# Patient Record
Sex: Male | Born: 1985 | Race: White | Hispanic: No | Marital: Single | Smoking: Current every day smoker
Health system: Southern US, Community
[De-identification: ages and names within clinical notes are randomized; demographics above are authoritative.]

## PROBLEM LIST (undated history)

## (undated) DIAGNOSIS — I639 Cerebral infarction, unspecified: Secondary | ICD-10-CM

## (undated) HISTORY — PX: WISDOM TOOTH EXTRACTION: SHX21

---
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
## 2003-06-26 ENCOUNTER — Emergency Department (HOSPITAL_COMMUNITY): Admission: EM | Admit: 2003-06-26 | Discharge: 2003-06-27 | Payer: Self-pay | Admitting: Emergency Medicine

## 2005-05-04 ENCOUNTER — Emergency Department (HOSPITAL_COMMUNITY): Admission: EM | Admit: 2005-05-04 | Discharge: 2005-05-04 | Payer: Self-pay | Admitting: *Deleted

## 2005-06-07 ENCOUNTER — Emergency Department (HOSPITAL_COMMUNITY): Admission: EM | Admit: 2005-06-07 | Discharge: 2005-06-07 | Payer: Self-pay | Admitting: Family Medicine

## 2005-09-21 ENCOUNTER — Emergency Department (HOSPITAL_COMMUNITY): Admission: AC | Admit: 2005-09-21 | Discharge: 2005-09-21 | Payer: Self-pay

## 2006-08-31 ENCOUNTER — Emergency Department: Payer: Self-pay | Admitting: Emergency Medicine

## 2007-07-18 ENCOUNTER — Inpatient Hospital Stay (HOSPITAL_COMMUNITY): Admission: EM | Admit: 2007-07-18 | Discharge: 2007-07-20 | Payer: Self-pay | Admitting: Emergency Medicine

## 2007-09-17 ENCOUNTER — Emergency Department (HOSPITAL_COMMUNITY): Admission: EM | Admit: 2007-09-17 | Discharge: 2007-09-17 | Payer: Self-pay | Admitting: Emergency Medicine

## 2010-05-18 ENCOUNTER — Emergency Department (HOSPITAL_COMMUNITY): Admission: EM | Admit: 2010-05-18 | Discharge: 2010-05-18 | Payer: Self-pay | Admitting: Emergency Medicine

## 2010-07-04 ENCOUNTER — Emergency Department (HOSPITAL_COMMUNITY): Admission: EM | Admit: 2010-07-04 | Discharge: 2010-07-04 | Payer: Self-pay | Admitting: Emergency Medicine

## 2010-11-13 ENCOUNTER — Emergency Department (HOSPITAL_BASED_OUTPATIENT_CLINIC_OR_DEPARTMENT_OTHER)
Admission: EM | Admit: 2010-11-13 | Discharge: 2010-11-13 | Disposition: A | Discharge status: Home / Self Care | Payer: Self-pay | Attending: Emergency Medicine | Admitting: Emergency Medicine

## 2010-11-13 ENCOUNTER — Emergency Department (INDEPENDENT_AMBULATORY_CARE_PROVIDER_SITE_OTHER): Payer: Self-pay

## 2010-11-13 DIAGNOSIS — I10 Essential (primary) hypertension: Secondary | ICD-10-CM | POA: Insufficient documentation

## 2010-11-13 DIAGNOSIS — IMO0002 Reserved for concepts with insufficient information to code with codable children: Secondary | ICD-10-CM | POA: Insufficient documentation

## 2010-11-13 DIAGNOSIS — M25519 Pain in unspecified shoulder: Secondary | ICD-10-CM

## 2010-11-13 DIAGNOSIS — Y93B3 Activity, free weights: Secondary | ICD-10-CM | POA: Insufficient documentation

## 2010-11-13 DIAGNOSIS — Y92009 Unspecified place in unspecified non-institutional (private) residence as the place of occurrence of the external cause: Secondary | ICD-10-CM | POA: Insufficient documentation

## 2010-11-13 DIAGNOSIS — X503XXA Overexertion from repetitive movements, initial encounter: Secondary | ICD-10-CM | POA: Insufficient documentation

## 2010-12-18 ENCOUNTER — Emergency Department (INDEPENDENT_AMBULATORY_CARE_PROVIDER_SITE_OTHER): Payer: Self-pay

## 2010-12-18 ENCOUNTER — Emergency Department (HOSPITAL_BASED_OUTPATIENT_CLINIC_OR_DEPARTMENT_OTHER)
Admission: EM | Admit: 2010-12-18 | Discharge: 2010-12-18 | Disposition: A | Discharge status: Home / Self Care | Payer: Self-pay | Attending: Emergency Medicine | Admitting: Emergency Medicine

## 2010-12-18 DIAGNOSIS — W208XXA Other cause of strike by thrown, projected or falling object, initial encounter: Secondary | ICD-10-CM | POA: Insufficient documentation

## 2010-12-18 DIAGNOSIS — I10 Essential (primary) hypertension: Secondary | ICD-10-CM | POA: Insufficient documentation

## 2010-12-18 DIAGNOSIS — S92919A Unspecified fracture of unspecified toe(s), initial encounter for closed fracture: Secondary | ICD-10-CM

## 2010-12-18 DIAGNOSIS — S91109A Unspecified open wound of unspecified toe(s) without damage to nail, initial encounter: Secondary | ICD-10-CM | POA: Insufficient documentation

## 2010-12-18 DIAGNOSIS — S97109A Crushing injury of unspecified toe(s), initial encounter: Secondary | ICD-10-CM | POA: Insufficient documentation

## 2010-12-18 DIAGNOSIS — F17200 Nicotine dependence, unspecified, uncomplicated: Secondary | ICD-10-CM | POA: Insufficient documentation

## 2010-12-24 NOTE — Discharge Summary (Signed)
NAMECELESTINO, ACKERMAN             ACCOUNT NO.:  [id number]   MEDICAL RECORD NO.:  [id number]          PATIENT TYPE:  INP   LOCATION:  3034                         FACILITY:  [facility]   PHYSICIAN:  Deanna Artis. Hickling, M.D.DATE OF BIRTH:  [birth year]   DATE OF ADMISSION:  07/18/2007  DATE OF DISCHARGE:  07/20/2007                               DISCHARGE SUMMARY   FINAL DIAGNOSES:  1. Factitious right hemiparesis, etiology unknown.  2. Polysubstance abuse and dependence (tobacco, alcohol, recreational      drugs and prescription drugs).  3. Diffuse nonspecific pain syndrome.   PROCEDURES:  1. An MRI scan of the brain without contrast.  2. A CT scan of the brain.   EVALUATIONS:  By physical therapy and psychiatry (Dr. Antonietta Breach).   SUMMARY OF THE HOSPITALIZATION:  The patient was admitted with altered  mental status related to severe inebriation (alcohol level 271) and with  an apparent right hemiparesis.  The patient is left-hand dominant.  His  speech was slurred.  He was able to speak in sentences but he could not  tell me his age, he was not cooperative, he had apparent photophobia.  He complained of severe headache just prior to this event.  He moved his  left side well.  He moved his right arm very little and his leg to a  slight degree.  He did not withdraw to noxious stimuli on the right  side.   I was concerned about the possibility of stroke, particularly given that  there was a history of stroke evaluated at [facility]  earlier in the year.   The MRI scan under sedation (Geodon plus Ativan) was normal.  There were  no prior lesions, no acute lesions.  The patient had a right sinusitis.  I could not rule out a seizure with a Todd's paresis because the  patient's weakness occurred when he was not being witnessed by anyone.   I was concerned about ongoing belligerent behavior and alcohol  withdrawal, but that never came to pass.  The patient had a  psychiatric  consult with Dr. Antonietta Breach who said that he had polysubstance  dependence but he did not appear to have a somatoform disorder.   The physical therapist during her evaluation initially observed a  strength of 3/5 in the right lower extremity, 4/5 to 5/5 in the left  lower extremity, normal in the upper extremities.  She noted that the  patient initially was dragging his right foot and his stride returned to  normal with very fast gait and no loss of balance.   In speaking with the patient's roommate and his parents, it is very  clear that he has refused to fully embrace any attempts to help him deal  with the substance abuse or any underlying psychiatric disorders that he  has.   On examination today the patient was awake, alert, with no complaints.  Temperature 97.6, blood pressure 136/94.  All other blood pressures show  normal diastolic blood pressures and I do not believe he has  hypertension.  Resting pulse 71, respirations 20, oxygen  saturation  100%.  The patient was asleep when I arrived.  His lungs were clear.   Neurologic examination:  Visual fields are full.  Symmetric facial  strength, midline tongue, air conduction greater than bone conduction.  Motor examination:  Normal strength in all four extremities.  Sensation  intact.  Gait had the appearance of right hemiparetic gait, sliding his  foot along the floor, a little less arm swing on the right.  I believe  that this is a factitious right hemiparesis and that he has substance  abuse.   PLAN:  Outpatient therapy as per Dr. Jeanie Sewer.  I have given the  patient my card but I have asked him to consider that there are many  people who care about him and that he needs to care about himself as  much as they do.  I told him that the behaviors that are his daily life  are leading nowhere but to permanent illness and disease that we cannot  fix.  Will not provide for narcotics or prescription drugs.  He is   advised to use naproxen over-the-counter and to engage in outpatient  substance abuse program.  Followup with me as needed.   His laboratory studies were as follows:  White blood cell count 9200;  hemoglobin 16.1; hematocrit 46.9; MCV 88.6; platelet count 425,000; 58  neutrophils, 32 lymphs, 7 monocytes, 3 eosinophils, 1 basophil.  Prothrombin time 12.7, INR 0.9, PT 31.  Sodium 136, potassium 3.8,  chloride 101, CO2 24, glucose 98, BUN 7, creatinine 1.29, GFR greater  than 60, total bilirubin 0.7, alkaline phosphatase 81, AST 37, ALT 30,  total protein 7.5, albumin 4.3, calcium 8.6.  Creatinine kinase 462 (the  patient had fallen), CK-MB slightly elevated at 5.2, troponin 0.03  - no  indication of myocardial injury.  Urine drug screen positive for  cocaine.  Serum alcohol 271.   The patient is discharged in improved condition.      Deanna Artis. Sharene Skeans, M.D.  Electronically Signed     [facility]/MEDQ  D:  07/20/2007  T:  07/20/2007  Job:  [id number]
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
## 2010-12-27 ENCOUNTER — Emergency Department (HOSPITAL_BASED_OUTPATIENT_CLINIC_OR_DEPARTMENT_OTHER)
Admission: EM | Admit: 2010-12-27 | Discharge: 2010-12-27 | Disposition: A | Discharge status: Home / Self Care | Payer: Self-pay | Attending: Emergency Medicine | Admitting: Emergency Medicine

## 2010-12-27 DIAGNOSIS — I10 Essential (primary) hypertension: Secondary | ICD-10-CM | POA: Insufficient documentation

## 2010-12-27 DIAGNOSIS — Z4802 Encounter for removal of sutures: Secondary | ICD-10-CM | POA: Insufficient documentation

## 2010-12-27 NOTE — Consult Note (Signed)
NAMEJAZION, Jacob [name]             ACCOUNT NO.:  [id number]   MEDICAL RECORD NO.:  [id number]          PATIENT TYPE:  INP   LOCATION:  3034                         FACILITY:  [facility]   PHYSICIAN:  Antonietta Breach, M.D.  DATE OF BIRTH:  [birth year]   DATE OF CONSULTATION:  DATE OF DISCHARGE:  07/20/2007                                 CONSULTATION   REQUESTING PHYSICIAN:  Deanna Artis. Sharene Skeans, M.D.   REASON FOR CONSULTATION:  Rule out somatoform disorder.   HISTORY:  Jacob [name] is a 25 year old male admitted to the [facility] on July 18, 2007, with a complaint of headache along with  right-sided weakness and slurred speech.   The patient notably has been abusing a number of substances including  alcohol.  He does have a history of using cocaine as well as marijuana.   The patient's difficulties with strength have resolved.  He is moving  all his extremities and walking.  He has intact interests and  constructive future goals.  He is not having any thoughts of harming  himself or others.   PAST PSYCHIATRIC HISTORY:  The patient had a period of severe agitation  in February 2007, when intoxicated on alcohol.  He was treated in the  emergency room with Geodon.  At that time also his urine tested positive  for marijuana and cocaine.   The patient has been taking Lexapro and Seroquel.   Please see the medical record.   MENTAL STATUS EXAM:  Jacob [name] is alert, oriented to all spheres.  Memory within normal limits.  Thought process logical, coherent, goal-  directed, thought content.  No thoughts of harming himself.  No thoughts  of harming others.  No delusions.  No hallucinations.  Affect is broad  and appropriate.  Judgment is within normal limits.  Insight is partial.  Affect broad and appropriate.  Mood within normal limits.   ASSESSMENT:  Polysubstance dependence.  No evidence of somatoform  disorder.   Jacob [name] is not at risk to harm himself or others.  He  does agree to  use emergency services immediately for any psychiatric emergency  symptoms.   RECOMMENDATIONS:  The patient could benefit from outpatient  psychotherapy.  He could also benefit from 12-step groups and chemical  dependency program.      Antonietta Breach, M.D.  Electronically Signed     JW/MEDQ  D:  12/22/2007  T:  12/23/2007  Job:  [id number]

## 2011-05-19 LAB — PROTIME-INR: Prothrombin Time: 12.7

## 2011-05-19 LAB — CK TOTAL AND CKMB (NOT AT ARMC)
CK, MB: 5.2 — ABNORMAL HIGH
Relative Index: 1.1
Total CK: 462 — ABNORMAL HIGH

## 2011-05-19 LAB — COMPREHENSIVE METABOLIC PANEL
ALT: 30
Alkaline Phosphatase: 81
BUN: 7
Chloride: 101
GFR calc Af Amer: >60
Sodium: 136
Total Bilirubin: 0.7

## 2011-05-19 LAB — DIFFERENTIAL
Basophils Relative: 1
Eosinophils Absolute: 0.2
Lymphocytes Relative: 32
Neutrophils Relative %: 58

## 2011-05-19 LAB — RAPID URINE DRUG SCREEN, HOSP PERFORMED
Barbiturates: NOT DETECTED
Benzodiazepines: NOT DETECTED
Cocaine: POSITIVE — AB
Opiates: NOT DETECTED

## 2011-05-19 LAB — APTT: aPTT: 31

## 2011-05-19 LAB — TROPONIN I: Troponin I: 0.03

## 2011-05-19 LAB — CBC
HCT: 46.9
MCHC: 34.4
MCV: 88.6
RBC: 5.29

## 2011-05-19 LAB — ETHANOL: Alcohol, Ethyl (B): 271 — ABNORMAL HIGH

## 2012-12-03 ENCOUNTER — Encounter (HOSPITAL_COMMUNITY): Payer: Self-pay | Admitting: Emergency Medicine

## 2012-12-03 ENCOUNTER — Emergency Department (HOSPITAL_COMMUNITY)
Admission: EM | Admit: 2012-12-03 | Discharge: 2012-12-03 | Disposition: A | Discharge status: Home / Self Care | Payer: Self-pay | Attending: Emergency Medicine | Admitting: Emergency Medicine

## 2012-12-03 DIAGNOSIS — Y9389 Activity, other specified: Secondary | ICD-10-CM | POA: Insufficient documentation

## 2012-12-03 DIAGNOSIS — R404 Transient alteration of awareness: Secondary | ICD-10-CM | POA: Insufficient documentation

## 2012-12-03 DIAGNOSIS — F17200 Nicotine dependence, unspecified, uncomplicated: Secondary | ICD-10-CM | POA: Insufficient documentation

## 2012-12-03 DIAGNOSIS — T401X1A Poisoning by heroin, accidental (unintentional), initial encounter: Secondary | ICD-10-CM | POA: Insufficient documentation

## 2012-12-03 DIAGNOSIS — T401X4A Poisoning by heroin, undetermined, initial encounter: Secondary | ICD-10-CM | POA: Insufficient documentation

## 2012-12-03 DIAGNOSIS — Y92009 Unspecified place in unspecified non-institutional (private) residence as the place of occurrence of the external cause: Secondary | ICD-10-CM | POA: Insufficient documentation

## 2012-12-03 DIAGNOSIS — F1010 Alcohol abuse, uncomplicated: Secondary | ICD-10-CM | POA: Insufficient documentation

## 2012-12-03 DIAGNOSIS — Z8673 Personal history of transient ischemic attack (TIA), and cerebral infarction without residual deficits: Secondary | ICD-10-CM | POA: Insufficient documentation

## 2012-12-03 HISTORY — DX: Cerebral infarction, unspecified: I63.9

## 2012-12-03 MED ORDER — ONDANSETRON HCL 4 MG/2ML IJ SOLN
4.0000 mg | Freq: Once | INTRAMUSCULAR | Status: AC
  Administered 2012-12-03: 4 mg via INTRAVENOUS
  Filled 2012-12-03: qty 2

## 2012-12-03 NOTE — ED Notes (Signed)
Bed:RESB<BR> Expected date:<BR> Expected time:<BR> Means of arrival:<BR> Comments:<BR>
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
## 2012-12-03 NOTE — ED Provider Notes (Signed)
History     CSN: [id number]  Arrival date & time 12/03/12  2136   First MD Initiated Contact with Patient 12/03/12 2146      Chief Complaint  Patient presents with  . Drug Overdose    (Consider location/radiation/quality/duration/timing/severity/associated sxs/prior treatment) Patient is a 27 y.o. male presenting with Overdose. The history is provided by the patient.  Drug Overdose   patient here after starting heroin and becoming unresponsive after that. EMS was called and patient agonal breathing. He was given Narcan and completely returned to his baseline. He did have pinpoint pupils prior to this. He denies that this was a suicide attempt. Admits to using alcohol tonight. States he was attempting to become intoxicated and in that is why he used heroine. No other complaints of at this time  Past Medical History  Diagnosis Date  . Stroke     Past Surgical History  Procedure Laterality Date  . Wisdom tooth extraction      No family history on file.  History  Substance Use Topics  . Smoking status: Current Every Day Smoker -- 0.50 packs/day for 10 years    Types: Cigarettes  . Smokeless tobacco: Never Used  . Alcohol Use: 7.2 oz/week    12 Cans of beer per week      Review of Systems  All other systems reviewed and are negative.    Allergies  Review of patient's allergies indicates no known allergies.  Home Medications  No current outpatient prescriptions on file.  BP 163/106  Pulse 98  Resp 16  SpO2 100%  Physical Exam  Nursing note and vitals reviewed. Constitutional: He is oriented to person, place, and time. He appears well-developed and well-nourished.  Non-toxic appearance. No distress.  HENT:  Head: Normocephalic and atraumatic.  Eyes: Conjunctivae, EOM and lids are normal. Pupils are equal, round, and reactive to light.  Neck: Normal range of motion. Neck supple. No tracheal deviation present. No mass present.  Cardiovascular: Normal rate,  regular rhythm and normal heart sounds.  Exam reveals no gallop.   No murmur heard. Pulmonary/Chest: Effort normal and breath sounds normal. No stridor. No respiratory distress. He has no decreased breath sounds. He has no wheezes. He has no rhonchi. He has no rales.  Abdominal: Soft. Normal appearance and bowel sounds are normal. He exhibits no distension. There is no tenderness. There is no rebound and no CVA tenderness.  Musculoskeletal: Normal range of motion. He exhibits no edema and no tenderness.  Neurological: He is alert and oriented to person, place, and time. He has normal strength. No cranial nerve deficit or sensory deficit. GCS eye subscore is 4. GCS verbal subscore is 5. GCS motor subscore is 6.  Skin: Skin is warm and dry. No abrasion and no rash noted.  Psychiatric: He has a normal mood and affect. His speech is normal and behavior is normal.    ED Course  Procedures (including critical care time)  Labs Reviewed - No data to display No results found.   No diagnosis found.    MDM  Pt monitored here and has remained at stable--ok for d/c        Toy Baker, MD 12/03/12 2318

## 2013-02-11 ENCOUNTER — Emergency Department (HOSPITAL_BASED_OUTPATIENT_CLINIC_OR_DEPARTMENT_OTHER): Payer: No Typology Code available for payment source

## 2013-02-11 ENCOUNTER — Emergency Department (HOSPITAL_COMMUNITY): Payer: Self-pay

## 2013-02-11 ENCOUNTER — Emergency Department (HOSPITAL_BASED_OUTPATIENT_CLINIC_OR_DEPARTMENT_OTHER)
Admission: EM | Admit: 2013-02-11 | Discharge: 2013-02-11 | Disposition: A | Discharge status: Home / Self Care | Payer: No Typology Code available for payment source | Attending: Emergency Medicine | Admitting: Emergency Medicine

## 2013-02-11 ENCOUNTER — Encounter (HOSPITAL_BASED_OUTPATIENT_CLINIC_OR_DEPARTMENT_OTHER): Payer: Self-pay | Admitting: *Deleted

## 2013-02-11 DIAGNOSIS — S40019A Contusion of unspecified shoulder, initial encounter: Secondary | ICD-10-CM | POA: Insufficient documentation

## 2013-02-11 DIAGNOSIS — S335XXA Sprain of ligaments of lumbar spine, initial encounter: Secondary | ICD-10-CM | POA: Insufficient documentation

## 2013-02-11 DIAGNOSIS — Z8673 Personal history of transient ischemic attack (TIA), and cerebral infarction without residual deficits: Secondary | ICD-10-CM | POA: Insufficient documentation

## 2013-02-11 DIAGNOSIS — S0993XA Unspecified injury of face, initial encounter: Secondary | ICD-10-CM | POA: Insufficient documentation

## 2013-02-11 DIAGNOSIS — S39012A Strain of muscle, fascia and tendon of lower back, initial encounter: Secondary | ICD-10-CM

## 2013-02-11 DIAGNOSIS — S161XXA Strain of muscle, fascia and tendon at neck level, initial encounter: Secondary | ICD-10-CM

## 2013-02-11 DIAGNOSIS — S0990XA Unspecified injury of head, initial encounter: Secondary | ICD-10-CM | POA: Insufficient documentation

## 2013-02-11 DIAGNOSIS — F17200 Nicotine dependence, unspecified, uncomplicated: Secondary | ICD-10-CM | POA: Insufficient documentation

## 2013-02-11 DIAGNOSIS — S0280XA Fracture of other specified skull and facial bones, unspecified side, initial encounter for closed fracture: Secondary | ICD-10-CM | POA: Insufficient documentation

## 2013-02-11 DIAGNOSIS — S40011A Contusion of right shoulder, initial encounter: Secondary | ICD-10-CM

## 2013-02-11 DIAGNOSIS — IMO0002 Reserved for concepts with insufficient information to code with codable children: Secondary | ICD-10-CM | POA: Insufficient documentation

## 2013-02-11 DIAGNOSIS — S139XXA Sprain of joints and ligaments of unspecified parts of neck, initial encounter: Secondary | ICD-10-CM | POA: Insufficient documentation

## 2013-02-11 DIAGNOSIS — Y92410 Unspecified street and highway as the place of occurrence of the external cause: Secondary | ICD-10-CM | POA: Insufficient documentation

## 2013-02-11 DIAGNOSIS — S46909A Unspecified injury of unspecified muscle, fascia and tendon at shoulder and upper arm level, unspecified arm, initial encounter: Secondary | ICD-10-CM | POA: Insufficient documentation

## 2013-02-11 DIAGNOSIS — S4980XA Other specified injuries of shoulder and upper arm, unspecified arm, initial encounter: Secondary | ICD-10-CM | POA: Insufficient documentation

## 2013-02-11 DIAGNOSIS — Y939 Activity, unspecified: Secondary | ICD-10-CM | POA: Insufficient documentation

## 2013-02-11 MED ORDER — HYDROCODONE-ACETAMINOPHEN 5-325 MG PO TABS: 2.0000 | ORAL_TABLET | ORAL | Status: DC | PRN

## 2013-02-11 MED ORDER — KETOROLAC TROMETHAMINE 60 MG/2ML IM SOLN
60.0000 mg | Freq: Once | INTRAMUSCULAR | Status: AC
  Administered 2013-02-11: 60 mg via INTRAMUSCULAR
  Filled 2013-02-11: qty 2
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
## 2013-06-07 ENCOUNTER — Encounter (HOSPITAL_BASED_OUTPATIENT_CLINIC_OR_DEPARTMENT_OTHER): Payer: Self-pay | Admitting: Emergency Medicine

## 2013-06-07 ENCOUNTER — Emergency Department (HOSPITAL_BASED_OUTPATIENT_CLINIC_OR_DEPARTMENT_OTHER)
Admission: EM | Admit: 2013-06-07 | Discharge: 2013-06-07 | Disposition: A | Discharge status: Home / Self Care | Attending: Emergency Medicine | Admitting: Emergency Medicine

## 2013-06-07 DIAGNOSIS — T7840XA Allergy, unspecified, initial encounter: Secondary | ICD-10-CM

## 2013-06-07 DIAGNOSIS — Z8673 Personal history of transient ischemic attack (TIA), and cerebral infarction without residual deficits: Secondary | ICD-10-CM | POA: Insufficient documentation

## 2013-06-07 DIAGNOSIS — K089 Disorder of teeth and supporting structures, unspecified: Secondary | ICD-10-CM | POA: Insufficient documentation

## 2013-06-07 DIAGNOSIS — F17200 Nicotine dependence, unspecified, uncomplicated: Secondary | ICD-10-CM | POA: Insufficient documentation

## 2013-06-07 MED ORDER — PREDNISONE 10 MG PO TABS: 20.0000 mg | ORAL_TABLET | Freq: Every day | ORAL | Status: DC

## 2013-06-07 MED ORDER — IBUPROFEN 800 MG PO TABS
800.0000 mg | ORAL_TABLET | Freq: Once | ORAL | Status: AC
  Administered 2013-06-07: 800 mg via ORAL
  Filled 2013-06-07: qty 1

## 2013-06-07 MED ORDER — EPINEPHRINE 0.3 MG/0.3ML IJ SOAJ: 0.3000 mg | INTRAMUSCULAR | Status: AC | PRN

## 2013-06-07 MED ORDER — PREDNISONE 50 MG PO TABS
60.0000 mg | ORAL_TABLET | Freq: Once | ORAL | Status: AC
  Administered 2013-06-07: 60 mg via ORAL
  Filled 2013-06-07 (×2): qty 1

## 2013-06-07 MED ORDER — DIPHENHYDRAMINE HCL 25 MG PO TABS: 25.0000 mg | ORAL_TABLET | Freq: Four times a day (QID) | ORAL | Status: DC
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
## 2013-06-07 NOTE — ED Provider Notes (Signed)
CSN: [id number]     Arrival date & time 06/07/13  1745 History  This chart was scribed for No att. providers found by Ronal Fear, ED Scribe. This patient was seen in room MH09/MH09 and the patient's care was started at 7:01 PM.   Chief Complaint  Patient presents with  . Allergic Reaction    Patient is a 27 y.o. male presenting with allergic reaction. The history is provided by the patient. No language interpreter was used.  Allergic Reaction Presenting symptoms: swelling   Presenting symptoms: no difficulty breathing and no wheezing   Severity:  Mild Context: chemicals   Relieved by:  Epinephrine and antihistamines Worsened by:  Nothing tried  HPI Comments: Jacob [name] is a 27 y.o. male with a history of strokes who presents to the Emergency Department complaining of gradual onset tongue swelling after drinking water at a jail around 4:00pm today. Pt was given benadryl one shot of epinephrine at the jail with some relief around 4:30 pm. Upon arrival to the ED h he no longer has tongue swelling. Currently c/o dental pain from a broken tooth..  Pt denies sore throat prior to occurrence. Pt is also complaining of dental pain. Pt does not appear to be in any acute distress with no other complaints. No rash or itching.  Denies new exposures or foods today except feels the symptoms began after drinking from a water fountain.  No hx of prior allergic reactions.    Past Medical History  Diagnosis Date  . Stroke    Past Surgical History  Procedure Laterality Date  . Wisdom tooth extraction     No family history on file. History  Substance Use Topics  . Smoking status: Current Every Day Smoker -- 0.50 packs/day for 10 years    Types: Cigarettes  . Smokeless tobacco: Never Used  . Alcohol Use: 7.2 oz/week    12 Cans of beer per week    Review of Systems  HENT: Positive for facial swelling. Negative for sore throat.   Respiratory: Negative for wheezing.   All other systems  reviewed and are negative.    Allergies  Review of patient's allergies indicates no known allergies.  Home Medications   Current Outpatient Rx  Name  Route  Sig  Dispense  Refill  . diphenhydrAMINE (BENADRYL) 25 MG tablet   Oral   Take 1 tablet (25 mg total) by mouth every 6 (six) hours. For the next 2-3 days, then space out to an as needed basis   20 tablet   0   . EPINEPHrine (EPIPEN) 0.3 mg/0.3 mL SOAJ injection   Intramuscular   Inject 0.3 mLs (0.3 mg total) into the muscle as needed.   2 Device   0   . HYDROcodone-acetaminophen (NORCO) 5-325 MG per tablet   Oral   Take 2 tablets by mouth every 4 (four) hours as needed for pain.   15 tablet   0   . predniSONE (DELTASONE) 10 MG tablet   Oral   Take 2 tablets (20 mg total) by mouth daily. 6, 5, 4, 3, 2, 1 taper- take for 3 days each po qD   63 tablet   0    BP 121/70  Pulse 72  Temp(Src) 99.2 F (37.3 C) (Oral)  Resp 16  Ht 5\' 11"  (1.803 m)  Wt 196 lb (88.905 kg)  BMI 27.35 kg/m2  SpO2 100% Physical Exam  Nursing note and vitals reviewed. Constitutional: He is oriented to person, place,  and time. He appears well-developed and well-nourished. No distress.  HENT:  Head: Normocephalic and atraumatic.  Mouth/Throat: Oropharynx is clear and moist.  No hives, no swelling of the tongue  Eyes: EOM are normal. Pupils are equal, round, and reactive to light.  Neck: Normal range of motion. Neck supple. No tracheal deviation present.  Cardiovascular: Normal rate.   Pulmonary/Chest: Effort normal and breath sounds normal. No respiratory distress.  Abdominal: Soft. Bowel sounds are normal. There is no tenderness.  Musculoskeletal: Normal range of motion. He exhibits no edema.  Neurological: He is alert and oriented to person, place, and time.  Skin: Skin is warm and dry.  Psychiatric: He has a normal mood and affect. His behavior is normal.    ED Course  Procedures (including critical care time) DIAGNOSTIC  STUDIES: Oxygen Saturation is 99% on RA, normal by my interpretation.    COORDINATION OF CARE:    7:08 PM- Pt advised of plan for treatment observation and steroids for swelling and pt agrees.  8:59 PM pt has been observed x 4 hours since he received epi pen.  He has received steroids here in the ED.  No recurrence of tongue swelling.  Will discharge on steroids and to received scheduled benadryl.  Will also give epi pen rx.  Pt will need allergy testing.     Labs Review Labs Reviewed - No data to display Imaging Review No results found.  EKG Interpretation   None       MDM   1. Allergic reaction, initial encounter    Pt presenting with c/o tongue swelling and report of [facility].  He was treated there with benadryl and epipen.  Symptoms upon arrival have resolved.  No airway compromise or significant swelling or lips or tongue on my exam.  Pt speaking normally.  No wheezing.  Pt given steroids and observed until 4 hours after epipen.  No increase in symptoms.  Discharged to take benadryl every 6 hours for the next 2-3 days, in addition to steroids.  Given rx for epipen and follow up information for allergy testing.  Discharged with strict return precautions.  Pt agreeable with plan.  I personally performed the services described in this documentation, which was scribed in my presence. The recorded information has been reviewed and is accurate.    Ethelda Chick, MD 06/07/13 2134

## 2013-06-26 ENCOUNTER — Emergency Department (HOSPITAL_BASED_OUTPATIENT_CLINIC_OR_DEPARTMENT_OTHER)
Admission: EM | Admit: 2013-06-26 | Discharge: 2013-06-26 | Disposition: A | Discharge status: Home / Self Care | Payer: No Typology Code available for payment source | Attending: Emergency Medicine | Admitting: Emergency Medicine

## 2013-06-26 ENCOUNTER — Encounter (HOSPITAL_BASED_OUTPATIENT_CLINIC_OR_DEPARTMENT_OTHER): Payer: Self-pay | Admitting: Emergency Medicine

## 2013-06-26 DIAGNOSIS — K089 Disorder of teeth and supporting structures, unspecified: Secondary | ICD-10-CM | POA: Insufficient documentation

## 2013-06-26 DIAGNOSIS — K0889 Other specified disorders of teeth and supporting structures: Secondary | ICD-10-CM

## 2013-06-26 DIAGNOSIS — F17200 Nicotine dependence, unspecified, uncomplicated: Secondary | ICD-10-CM | POA: Insufficient documentation

## 2013-06-26 DIAGNOSIS — Z8673 Personal history of transient ischemic attack (TIA), and cerebral infarction without residual deficits: Secondary | ICD-10-CM | POA: Insufficient documentation

## 2013-06-26 DIAGNOSIS — R599 Enlarged lymph nodes, unspecified: Secondary | ICD-10-CM | POA: Insufficient documentation

## 2013-06-26 DIAGNOSIS — K029 Dental caries, unspecified: Secondary | ICD-10-CM | POA: Insufficient documentation

## 2013-06-26 MED ORDER — TRAMADOL HCL 50 MG PO TABS: 50.0000 mg | ORAL_TABLET | Freq: Four times a day (QID) | ORAL | Status: DC | PRN

## 2013-06-26 MED ORDER — PENICILLIN V POTASSIUM 500 MG PO TABS: 500.0000 mg | ORAL_TABLET | Freq: Three times a day (TID) | ORAL | Status: DC

## 2013-06-26 NOTE — ED Provider Notes (Signed)
CSN: [id number]     Arrival date & time 06/26/13  1609 History   First MD Initiated Contact with Patient 06/26/13 1624     Chief Complaint  Patient presents with  . Dental Pain   (Consider location/radiation/quality/duration/timing/severity/associated sxs/prior Treatment) Patient is a 27 y.o. male presenting with tooth pain. The history is provided by the patient. No language interpreter was used.  Dental Pain Location:  Lower Lower teeth location:  31/RL 2nd molar Associated symptoms: no facial swelling and no fever   Associated symptoms comment:  Dental pain for the past 2 weeks, worsening over time. He has not checked his temperature but reports he feels like he was "warm" with fever, and sweating last night.    Past Medical History  Diagnosis Date  . Stroke    Past Surgical History  Procedure Laterality Date  . Wisdom tooth extraction     No family history on file. History  Substance Use Topics  . Smoking status: Current Every Day Smoker -- 0.50 packs/day for 10 years    Types: Cigarettes  . Smokeless tobacco: Never Used  . Alcohol Use: 7.2 oz/week    12 Cans of beer per week    Review of Systems  Constitutional: Negative for fever.  HENT: Positive for dental problem. Negative for facial swelling.     Allergies  Review of patient's allergies indicates no known allergies.  Home Medications   Current Outpatient Rx  Name  Route  Sig  Dispense  Refill  . EPINEPHrine (EPIPEN) 0.3 mg/0.3 mL SOAJ injection   Intramuscular   Inject 0.3 mLs (0.3 mg total) into the muscle as needed.   2 Device   0    BP 121/76  Pulse 59  Temp(Src) 98.4 F (36.9 C) (Oral)  Resp 16  Wt 200 lb (90.719 kg)  SpO2 100% Physical Exam  Constitutional: He appears well-developed and well-nourished. No distress.  HENT:  Significant decay of #31 without pointing or drainage abscess visualized.   Lymphadenopathy:       Head (right side): Submental adenopathy present.    He has cervical  adenopathy.       Right cervical: Superficial cervical adenopathy present.    ED Course  Procedures (including critical care time) Labs Review Labs Reviewed - No data to display Imaging Review No results found.  EKG Interpretation   None       MDM  No diagnosis found. 1. Dental caries 2. Dental pain  Will conver with abx and give pain control taking patient's drug abuse history (drug overdose/heroin) into consideration.     Arnoldo Hooker, PA-C 06/26/13 1706

## 2013-06-26 NOTE — ED Notes (Signed)
Right sided dental pain x 2 weeks.  No known fever.  No swelling.

## 2013-06-26 NOTE — ED Provider Notes (Signed)
  Medical screening examination/treatment/procedure(s) were performed by non-physician practitioner and as supervising physician I was immediately available for consultation/collaboration.  EKG Interpretation   None          Gerhard Munch, MD 06/26/13 1928

## 2013-08-04 ENCOUNTER — Emergency Department (HOSPITAL_BASED_OUTPATIENT_CLINIC_OR_DEPARTMENT_OTHER)
Admission: EM | Admit: 2013-08-04 | Discharge: 2013-08-04 | Disposition: A | Discharge status: Home / Self Care | Payer: No Typology Code available for payment source | Attending: Emergency Medicine | Admitting: Emergency Medicine

## 2013-08-04 ENCOUNTER — Encounter (HOSPITAL_BASED_OUTPATIENT_CLINIC_OR_DEPARTMENT_OTHER): Payer: Self-pay | Admitting: Emergency Medicine

## 2013-08-04 DIAGNOSIS — Z792 Long term (current) use of antibiotics: Secondary | ICD-10-CM | POA: Insufficient documentation

## 2013-08-04 DIAGNOSIS — Z8673 Personal history of transient ischemic attack (TIA), and cerebral infarction without residual deficits: Secondary | ICD-10-CM | POA: Insufficient documentation

## 2013-08-04 DIAGNOSIS — K029 Dental caries, unspecified: Secondary | ICD-10-CM | POA: Insufficient documentation

## 2013-08-04 DIAGNOSIS — R509 Fever, unspecified: Secondary | ICD-10-CM | POA: Insufficient documentation

## 2013-08-04 DIAGNOSIS — F17200 Nicotine dependence, unspecified, uncomplicated: Secondary | ICD-10-CM | POA: Insufficient documentation

## 2013-08-04 MED ORDER — IBUPROFEN 800 MG PO TABS: 800.0000 mg | ORAL_TABLET | Freq: Three times a day (TID) | ORAL | Status: DC | PRN

## 2013-08-04 MED ORDER — PENICILLIN V POTASSIUM 500 MG PO TABS: 500.0000 mg | ORAL_TABLET | Freq: Four times a day (QID) | ORAL | Status: DC

## 2013-08-04 NOTE — ED Provider Notes (Signed)
TIME SEEN: 10:58 AM  CHIEF COMPLAINT: Dental pain  HPI: Patient is a 27 y.o. male with a history of substance abuse who presents to the emergency department with right lower dental pain for several months. He states that over the past several days he has had swelling of his right face and subjective fevers. No nausea, vomiting or diarrhea. He took 800 mg of ibuprofen at home prior to arrival. Patient was seen in the emergency department approximately one month ago for similar symptoms. 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  ROS: See HPI Constitutional: Subjective fever  Eyes: no drainage  ENT: no runny nose   Cardiovascular:  no chest pain  Resp: no SOB  GI: no vomiting GU: no dysuria Integumentary: no rash  Allergy: no hives  Musculoskeletal: no leg swelling  Neurological: no slurred speech ROS otherwise negative  PAST MEDICAL HISTORY/PAST SURGICAL HISTORY:  Past Medical History  Diagnosis Date  . Stroke     MEDICATIONS:  Prior to Admission medications   Medication Sig Start Date End Date Taking? Authorizing Provider  EPINEPHrine (EPIPEN) 0.3 mg/0.3 mL SOAJ injection Inject 0.3 mLs (0.3 mg total) into the muscle as needed. 06/07/13   Ethelda Chick, MD  penicillin v potassium (VEETID) 500 MG tablet Take 1 tablet (500 mg total) by mouth 3 (three) times daily. 06/26/13   Shari A Upstill, PA-C  traMADol (ULTRAM) 50 MG tablet Take 1 tablet (50 mg total) by mouth every 6 (six) hours as needed. 06/26/13   Shari A Upstill, PA-C    ALLERGIES:  No Known Allergies  SOCIAL HISTORY:  History  Substance Use Topics  . Smoking status: Current Every Day Smoker -- 0.50 packs/day for 10 years    Types: Cigarettes  . Smokeless tobacco: Never Used  . Alcohol Use: No    FAMILY HISTORY: No family history on file.  EXAM: BP 133/86  Pulse 88  Temp(Src) 97.9 F (36.6 C) (Oral)  Resp 18  Ht 5\' 11"  (1.803 m)  Wt 200 lb (90.719 kg)  BMI  27.91 kg/m2  SpO2 100% CONSTITUTIONAL: Alert and oriented and responds appropriately to questions. Well-appearing; well-nourished HEAD: Normocephalic EYES: Conjunctivae clear, PERRL ENT: normal nose; no rhinorrhea; moist mucous membranes; pharynx without lesions noted; multiple dental caries, patient has a fractured right third molar with no associated, swelling, erythema or drainage; no trismus or drooling; no voice changes or uvular deviation; no appreciated facial swelling or signs of Ludwig angina NECK: Supple, no meningismus, no LAD  CARD: RRR; S1 and S2 appreciated; no murmurs, no clicks, no rubs, no gallops RESP: Normal chest excursion without splinting or tachypnea; breath sounds clear and equal bilaterally; no wheezes, no rhonchi, no rales,  ABD/GI: Normal bowel sounds; non-distended; soft, non-tender, no rebound, no guarding BACK:  The back appears normal and is non-tender to palpation, there is no CVA tenderness EXT: Normal ROM in all joints; non-tender to palpation; no edema; normal capillary refill; no cyanosis    SKIN: Normal color for age and race; warm NEURO: Moves all extremities equally PSYCH: The patient's mood and manner are appropriate. Grooming and personal hygiene are appropriate.  MEDICAL DECISION MAKING: Patient here with dental caries. He is concerned this may be infected but there is no overt signs of infection on exam. He is well-appearing, nontoxic. We'll discharge home with penicillin. Have discussed with patient that I do not provide contacts for dental pain and I  recommended he continue taking ibuprofen 800 mg every 8 hours. Have offered a local injection for pain control which patient refuses. He states he has followup with a dentist. Given return precautions. Patient verbalizes understanding and is comfortable with plan.      Layla Maw Dillon Livermore, DO 08/04/13 1120

## 2013-08-04 NOTE — ED Notes (Signed)
Dental pain x "months".  Pt states pain is worse today.

## 2013-12-25 ENCOUNTER — Encounter (HOSPITAL_BASED_OUTPATIENT_CLINIC_OR_DEPARTMENT_OTHER): Payer: Self-pay | Admitting: Emergency Medicine

## 2013-12-25 ENCOUNTER — Emergency Department (HOSPITAL_BASED_OUTPATIENT_CLINIC_OR_DEPARTMENT_OTHER)
Admission: EM | Admit: 2013-12-25 | Discharge: 2013-12-25 | Disposition: A | Discharge status: Home / Self Care | Payer: No Typology Code available for payment source | Attending: Emergency Medicine | Admitting: Emergency Medicine

## 2013-12-25 DIAGNOSIS — Z8673 Personal history of transient ischemic attack (TIA), and cerebral infarction without residual deficits: Secondary | ICD-10-CM | POA: Insufficient documentation

## 2013-12-25 DIAGNOSIS — A6920 Lyme disease, unspecified: Secondary | ICD-10-CM

## 2013-12-25 DIAGNOSIS — Z792 Long term (current) use of antibiotics: Secondary | ICD-10-CM | POA: Insufficient documentation

## 2013-12-25 DIAGNOSIS — F17200 Nicotine dependence, unspecified, uncomplicated: Secondary | ICD-10-CM | POA: Insufficient documentation

## 2013-12-25 MED ORDER — DOXYCYCLINE HYCLATE 100 MG PO CAPS: 100.0000 mg | ORAL_CAPSULE | Freq: Two times a day (BID) | ORAL | Status: DC

## 2013-12-25 NOTE — ED Provider Notes (Signed)
CSN: [id number]     Arrival date & time 12/25/13  1823 History  This chart was scribed for Hurman HornJohn M Tyri Elmore, MD by Beverly MilchJ Harrison Collins, ED Scribe. This patient was seen in room MH04/MH04 and the patient's care was started at 7:30 PM.    Chief Complaint  Patient presents with  . Rash    The history is provided by the patient. No language interpreter was used.   HPI Comments: Jacob [name] is a 28 y.o. male who presents to the Emergency Department complaining of tick bite to his back about two weeks ago. He states he noticed the itchy rash a few days ago and started a mild HA yesterday. Pt states the tick was very small, less than an 8th of an inch. Pt denies fever, nausea, vomiting, diarrhea, joint pain, SOB, diaphoresis, confusion, and CP.    Past Medical History  Diagnosis Date  . Stroke     Past Surgical History  Procedure Laterality Date  . Wisdom tooth extraction      No family history on file. History  Substance Use Topics  . Smoking status: Current Every Day Smoker -- 0.50 packs/day for 10 years    Types: Cigarettes  . Smokeless tobacco: Never Used  . Alcohol Use: No    Review of Systems 10 Systems reviewed and all are negative for acute change except as noted in the HPI.   Allergies  Review of patient's allergies indicates no known allergies.  Home Medications   Prior to Admission medications   Medication Sig Start Date End Date Taking? Authorizing Provider  EPINEPHrine (EPIPEN) 0.3 mg/0.3 mL SOAJ injection Inject 0.3 mLs (0.3 mg total) into the muscle as needed. 06/07/13  Yes Ethelda ChickMartha K Linker, MD  ibuprofen (ADVIL,MOTRIN) 800 MG tablet Take 1 tablet (800 mg total) by mouth every 8 (eight) hours as needed. 08/04/13  Yes Kristen N Ward, DO  penicillin v potassium (VEETID) 500 MG tablet Take 1 tablet (500 mg total) by mouth 3 (three) times daily. 06/26/13   Shari A Upstill, PA-C  penicillin v potassium (VEETID) 500 MG tablet Take 1 tablet (500 mg total) by mouth 4  (four) times daily. 08/04/13   Kristen N Ward, DO  traMADol (ULTRAM) 50 MG tablet Take 1 tablet (50 mg total) by mouth every 6 (six) hours as needed. 06/26/13   Arnoldo HookerShari A Upstill, PA-C    Triage Vitals: BP 133/65  Pulse 72  Temp(Src) 98.3 F (36.8 C) (Oral)  Resp 18  Ht 5\' 11"  (1.803 m)  Wt 220 lb (99.791 kg)  BMI 30.70 kg/m2   Physical Exam  Nursing note and vitals reviewed. Constitutional: He is oriented to person, place, and time. He appears well-developed and well-nourished. No distress.  Awake, alert, nontoxic appearance.  HENT:  Head: Normocephalic and atraumatic.  Eyes: Right eye exhibits no discharge. Left eye exhibits no discharge.  Neck: Neck supple.  Cardiovascular: Normal rate, regular rhythm and normal heart sounds.  Exam reveals no gallop and no friction rub.   No murmur heard. Pulmonary/Chest: Effort normal and breath sounds normal. No respiratory distress. He has no wheezes. He has no rales. He exhibits no tenderness.  Abdominal: Soft. Bowel sounds are normal. There is no tenderness. There is no rebound and no guarding.  Musculoskeletal: He exhibits no tenderness.  Target shaped lesion (red white red)  Neurological: He is alert and oriented to person, place, and time.  Mental status and motor strength appears baseline for patient and situation.  Skin: Skin is warm and dry. No rash noted. He is not diaphoretic.  Target shaped rash approximately 6 cm in diameter with mild erythema without petechiae, without purpura, and without vesicles.  Psychiatric: He has a normal mood and affect. His behavior is normal.    ED Course  Procedures (including critical care time)   COORDINATION OF CARE: 7:35 PM- Patient / Family / Caregiver understand and agree with initial ED impression and plan with expectations set for ED visit.   Labs Review Labs Reviewed - No data to display  Imaging Review No results found.   EKG Interpretation None      MDM  The patient appears  reasonably screened and/or stabilized for discharge and I doubt any other medical condition or other [facility] requiring further screening, evaluation, or treatment in the ED at this time prior to discharge.  Final diagnoses:  Early localized Lyme disease    I doubt any other EMC precluding discharge at this time including, but not necessarily limited to the following:sepsis.  I personally performed the services described in this documentation, which was scribed in my presence. The recorded information has been reviewed and is accurate.    Hurman HornJohn M Isa Kohlenberg, MD 12/27/13 [phone]

## 2013-12-25 NOTE — ED Notes (Signed)
Remove tick from back 2 weeks ago,  Ran fever that night  Nothing since,  Slight redness, and swelling, itching

## 2013-12-25 NOTE — Discharge Instructions (Signed)
Lyme Disease  You may have been bitten by a tick and are to watch for the development of Lyme Disease. Lyme Disease is an infection that is caused by a bacteria The bacteria causing this disease is named Borreilia burgdorferi. If a tick is infected with this bacteria and then bites you, then Lyme Disease may occur. These ticks are carried by deer and rodents such as rabbits and mice and infest grassy as well as forested areas. Fortunately most tick bites do not cause Lyme Disease.   Lyme Disease is easier to prevent than to treat. First, covering your legs with clothing when walking in areas where ticks are possibly abundant will prevent their attachment because ticks tend to stay within inches of the ground. Second, using insecticides containing DEET can be applied on skin or clothing. Last, because it takes about 12 to 24 hours for the tick to transmit the disease after attachment to the human host, you should inspect your body for ticks twice a day when you are in areas where Lyme Disease is common. You must look thoroughly when searching for ticks. The Ixodes tick that carries Lyme Disease is very small. It is around the size of a sesame seed (picture of tick is not actual size). Removal is best done by grasping the tick by the head and pulling it out. Do not to squeeze the body of the tick. This could inject the infecting bacteria into the bite site. Wash the area of the bite with an antiseptic solution after removal.   Lyme Disease is a disease that may affect many body systems. Because of the small size of the biting tick, most people do not notice being bitten. The first sign of an infection is usually a round red rash that extends out from the center of the tick bite. The center of the lesion may be blood colored (hemorrhagic) or have tiny blisters (vesicular). Most lesions have bright red outer borders and partial central clearing. This rash may extend out many inches in diameter, and multiple lesions may  be present. Other symptoms such as fatigue, headaches, chills and fever, general achiness and swelling of lymph glands may also occur. If this first stage of the disease is left untreated, these symptoms may gradually resolve by themselves, or progressive symptoms may occur because of spread of infection to other areas of the body.   Follow up with your caregiver to have testing and treatment if you have a tick bite and you develop any of the above complaints. Your caregiver may recommend preventative (prophylactic) medications which kill bacteria (antibiotics). Once a diagnosis of Lyme Disease is made, antibiotic treatment is highly likely to cure the disease. Effective treatment of late stage Lyme Disease may require longer courses of antibiotic therapy.   MAKE SURE YOU:   · Understand these instructions.  · Will watch your condition.  · Will get help right away if you are not doing well or get worse.  Document Released: 11/03/2000 Document Revised: 10/20/2011 Document Reviewed: 01/05/2009  [employer]® Patient Information ©2014 [employer].

## 2013-12-25 NOTE — ED Notes (Signed)
Report noticed tick embedded in back i week ago- now has raised rash and headache

## 2014-04-01 ENCOUNTER — Emergency Department (HOSPITAL_BASED_OUTPATIENT_CLINIC_OR_DEPARTMENT_OTHER): Payer: No Typology Code available for payment source

## 2014-04-01 ENCOUNTER — Emergency Department (HOSPITAL_COMMUNITY): Payer: No Typology Code available for payment source

## 2014-04-01 ENCOUNTER — Emergency Department (HOSPITAL_BASED_OUTPATIENT_CLINIC_OR_DEPARTMENT_OTHER)
Admission: EM | Admit: 2014-04-01 | Discharge: 2014-04-01 | Disposition: A | Discharge status: Home / Self Care | Payer: No Typology Code available for payment source | Attending: Emergency Medicine | Admitting: Emergency Medicine

## 2014-04-01 ENCOUNTER — Encounter (HOSPITAL_COMMUNITY): Payer: Self-pay | Admitting: Emergency Medicine

## 2014-04-01 ENCOUNTER — Encounter (HOSPITAL_BASED_OUTPATIENT_CLINIC_OR_DEPARTMENT_OTHER): Payer: Self-pay | Admitting: Emergency Medicine

## 2014-04-01 ENCOUNTER — Emergency Department (HOSPITAL_COMMUNITY)
Admission: EM | Admit: 2014-04-01 | Discharge: 2014-04-01 | Discharge status: Against Medical Advice | Payer: No Typology Code available for payment source | Source: Home / Self Care | Attending: Emergency Medicine | Admitting: Emergency Medicine

## 2014-04-01 DIAGNOSIS — Z79899 Other long term (current) drug therapy: Secondary | ICD-10-CM | POA: Insufficient documentation

## 2014-04-01 DIAGNOSIS — Y929 Unspecified place or not applicable: Secondary | ICD-10-CM | POA: Insufficient documentation

## 2014-04-01 DIAGNOSIS — Z8673 Personal history of transient ischemic attack (TIA), and cerebral infarction without residual deficits: Secondary | ICD-10-CM | POA: Insufficient documentation

## 2014-04-01 DIAGNOSIS — IMO0002 Reserved for concepts with insufficient information to code with codable children: Secondary | ICD-10-CM | POA: Insufficient documentation

## 2014-04-01 DIAGNOSIS — W19XXXA Unspecified fall, initial encounter: Secondary | ICD-10-CM

## 2014-04-01 DIAGNOSIS — S63259A Unspecified dislocation of unspecified finger, initial encounter: Secondary | ICD-10-CM

## 2014-04-01 DIAGNOSIS — Y939 Activity, unspecified: Secondary | ICD-10-CM | POA: Diagnosis not present

## 2014-04-01 DIAGNOSIS — S39012A Strain of muscle, fascia and tendon of lower back, initial encounter: Secondary | ICD-10-CM

## 2014-04-01 DIAGNOSIS — F17200 Nicotine dependence, unspecified, uncomplicated: Secondary | ICD-10-CM | POA: Diagnosis not present

## 2014-04-01 DIAGNOSIS — S63279A Dislocation of unspecified interphalangeal joint of unspecified finger, initial encounter: Secondary | ICD-10-CM | POA: Diagnosis not present

## 2014-04-01 DIAGNOSIS — Z792 Long term (current) use of antibiotics: Secondary | ICD-10-CM | POA: Insufficient documentation

## 2014-04-01 DIAGNOSIS — S63256A Unspecified dislocation of right little finger, initial encounter: Secondary | ICD-10-CM

## 2014-04-01 DIAGNOSIS — S6990XA Unspecified injury of unspecified wrist, hand and finger(s), initial encounter: Secondary | ICD-10-CM | POA: Insufficient documentation

## 2014-04-01 MED ORDER — LIDOCAINE HCL (PF) 1 % IJ SOLN
10.0000 mL | Freq: Once | INTRAMUSCULAR | Status: AC
  Administered 2014-04-01: 10 mL
  Filled 2014-04-01: qty 10

## 2014-04-01 MED ORDER — HYDROMORPHONE HCL PF 1 MG/ML IJ SOLN
1.0000 mg | Freq: Once | INTRAMUSCULAR | Status: AC
  Administered 2014-04-01: 1 mg via INTRAMUSCULAR
  Filled 2014-04-01: qty 1

## 2014-04-01 MED ORDER — OXYCODONE-ACETAMINOPHEN 5-325 MG PO TABS
1.0000 | ORAL_TABLET | Freq: Once | ORAL | Status: AC
  Administered 2014-04-01: 1 via ORAL
  Filled 2014-04-01: qty 1

## 2014-04-01 MED ORDER — OXYCODONE-ACETAMINOPHEN 5-325 MG PO TABS: 1.0000 | ORAL_TABLET | ORAL | Status: DC | PRN

## 2014-04-01 NOTE — ED Provider Notes (Signed)
CSN: [id number]     Arrival date & time 04/01/14  0725 History   First MD Initiated Contact with Patient 04/01/14 [phone]     Chief Complaint  Patient presents with  . Hand Injury     (Consider location/radiation/quality/duration/timing/severity/associated sxs/prior Treatment) HPI  28 year old male presents after a fall last night. He states that he was walking his dog and fell while chasing it. He states that he had "a few beers". Patient was just seen at [facility] long and left prior to any interventions because he states they were not treating his pain. According to nursing notes he stormed out, kicked the door and left. He's having  pain his right pinky and down his entire right arm. He's also complaining of severe pain in his back is worse than his pinky. He tried to relocate his finger without success. He points to his upper posterior shoulder and trapezius is a most painful area. He thinks he probably strained it. He took ibuprofen when he went home without relief. He is left handed.  Past Medical History  Diagnosis Date  . Stroke    Past Surgical History  Procedure Laterality Date  . Wisdom tooth extraction     No family history on file. History  Substance Use Topics  . Smoking status: Current Every Day Smoker -- 0.50 packs/day for 10 years    Types: Cigarettes  . Smokeless tobacco: Never Used  . Alcohol Use: 0.0 oz/week    Review of Systems  Musculoskeletal: Positive for back pain, joint swelling and myalgias.  Neurological: Negative for weakness and numbness.  All other systems reviewed and are negative.     Allergies  Review of patient's allergies indicates no known allergies.  Home Medications   Prior to Admission medications   Medication Sig Start Date End Date Taking? Authorizing Provider  doxycycline (VIBRAMYCIN) 100 MG capsule Take 1 capsule (100 mg total) by mouth 2 (two) times daily. One po bid x 10 days 12/25/13   Hurman Horn, MD  EPINEPHrine (EPIPEN) 0.3  mg/0.3 mL SOAJ injection Inject 0.3 mLs (0.3 mg total) into the muscle as needed. 06/07/13   Ethelda Chick, MD  ibuprofen (ADVIL,MOTRIN) 800 MG tablet Take 1 tablet (800 mg total) by mouth every 8 (eight) hours as needed. 08/04/13   Kristen N Ward, DO  penicillin v potassium (VEETID) 500 MG tablet Take 1 tablet (500 mg total) by mouth 3 (three) times daily. 06/26/13   Shari A Upstill, PA-C  penicillin v potassium (VEETID) 500 MG tablet Take 1 tablet (500 mg total) by mouth 4 (four) times daily. 08/04/13   Kristen N Ward, DO  traMADol (ULTRAM) 50 MG tablet Take 1 tablet (50 mg total) by mouth every 6 (six) hours as needed. 06/26/13   Shari A Upstill, PA-C   BP 152/89  Pulse 92  Temp(Src) 97.8 F (36.6 C) (Oral)  Resp 20  Ht 5\' 11"  (1.803 m)  Wt 137 lb (62.143 kg)  BMI 19.12 kg/m2  SpO2 98% Physical Exam  Nursing note and vitals reviewed. Constitutional: He is oriented to person, place, and time. He appears well-developed and well-nourished.  HENT:  Head: Normocephalic and atraumatic.  Right Ear: External ear normal.  Left Ear: External ear normal.  Nose: Nose normal.  Eyes: Right eye exhibits no discharge. Left eye exhibits no discharge.  Neck: Neck supple. No spinous process tenderness and no muscular tenderness present. Normal range of motion present.  Cardiovascular: Normal rate and intact distal pulses.  Abdominal: He exhibits no distension.  Musculoskeletal: He exhibits no edema.       Right shoulder: He exhibits tenderness (tenderness to posterior shoulder and upper trapezius).       Cervical back: He exhibits tenderness.       Thoracic back: He exhibits tenderness.       Hands: Neurological: He is alert and oriented to person, place, and time.  Skin: Skin is warm and dry.    ED Course  NERVE BLOCK Date/Time: 04/01/2014 8:15 AM Performed by: Pricilla Loveless T Authorized by: Pricilla Loveless T Consent: Verbal consent obtained. Risks and benefits: risks, benefits and  alternatives were discussed Consent given by: patient Indications: dislocation Body area: upper extremity Nerve: digital Laterality: left Patient sedated: no Preparation: Patient was prepped and draped in the usual sterile fashion. Patient position: sitting Needle gauge: 25 G Location technique: anatomical landmarks Local anesthetic: lidocaine 1% without epinephrine Anesthetic total: 4 ml Outcome: pain improved Patient tolerance: Patient tolerated the procedure well with no immediate complications.  Reduction of dislocation Date/Time: 04/01/2014 8:33 AM Performed by: Pricilla Loveless T Authorized by: Pricilla Loveless T Consent: Verbal consent obtained. Risks and benefits: risks, benefits and alternatives were discussed Consent given by: patient Preparation: Patient was prepped and draped in the usual sterile fashion. Local anesthesia used: yes Anesthesia: nerve block Patient sedated: no Patient tolerance: Patient tolerated the procedure well with no immediate complications. Comments: Uncomplicated left distal pinky finger dislocation reduction by manual traction-countertraction   (including critical care time) Labs Review Labs Reviewed - No data to display  Imaging Review Dg Thoracic Spine W/swimmers  04/01/2014   CLINICAL DATA:  Upper back pain after fall.  EXAM: THORACIC SPINE - 2 VIEW + SWIMMERS  COMPARISON:  None.  FINDINGS: There is no evidence of thoracic spine fracture. Alignment is normal. No other significant bone abnormalities are identified.  IMPRESSION: Normal thoracic spine.   Electronically Signed   By: Roque Lias M.D.   On: 04/01/2014 09:27   Dg Forearm Right  04/01/2014   CLINICAL DATA:  Injury  EXAM: RIGHT FOREARM - 2 VIEW  COMPARISON:  None.  FINDINGS: There is no evidence of fracture or other focal bone lesions. Soft tissues are unremarkable.  IMPRESSION: Negative.   Electronically Signed   By: Rise Mu M.D.   On: 04/01/2014 05:56   Dg Hand  Complete Right  04/01/2014   CLINICAL DATA:  ARM INJURY  EXAM: RIGHT HAND - COMPLETE 3+ VIEW  COMPARISON:  None.  FINDINGS: There is an acute dislocation of the right fifth DIP joint, with the distal phalanx dorsally dislocated. No associated fracture. No other acute abnormality within the hand. No soft tissue abnormality. No retained foreign body.  IMPRESSION: Dorsal dislocation of the right fifth PIP joint. No associated fracture.   Electronically Signed   By: Rise Mu M.D.   On: 04/01/2014 05:55   Dg Finger Little Right  04/01/2014   CLINICAL DATA:  Postreduction, dislocation  EXAM: RIGHT LITTLE FINGER 2+V  COMPARISON:  04/01/2014  FINDINGS: Normal alignment of the right fifth digit PIP joint following reduction. No displaced fracture. Preserved joint spaces. No significant arthropathy.  IMPRESSION: Anatomic alignment following reduction.   Electronically Signed   By: Ruel Favors M.D.   On: 04/01/2014 09:23     EKG Interpretation None      MDM   Final diagnoses:  Fall, initial encounter  Dislocation of right little finger, initial encounter  Back strain, initial encounter  Patient has multiple signs of muscular tenderness without fracture. Right pinky able to be relocated without difficulty. Splinted with aluminum splint. Patient had normal ambulation around ED. at this time I feel he is stable for discharge and will give hand followup as an outpatient.    Audree CamelScott T Nazire Fruth, MD 04/01/14 1444

## 2014-04-01 NOTE — ED Notes (Signed)
Pt left AMA prior to receiving xray report,  Pt was requesting pain medication and was advised multiple times without results of xray no more pain medication to be given.    Pt stormed out ,  Kicked door and said he was going to [employer]

## 2014-04-01 NOTE — ED Notes (Signed)
Patient fell last night and injured his right arm. Right pinky finger deformed, right wrist swollen, and right elbow pain.

## 2014-04-01 NOTE — ED Notes (Signed)
Pt was chasing his dog and fell injuring his left wrist and hand, pt is crying and screaming in triage, pt has obvious deformity to arm and hand

## 2014-04-01 NOTE — ED Provider Notes (Signed)
CSN: [id number]     Arrival date & time 04/01/14  9562 History   First MD Initiated Contact with Patient 04/01/14 [phone]     Chief Complaint  Patient presents with  . Arm Injury     (Consider location/radiation/quality/duration/timing/severity/associated sxs/prior Treatment) HPI Patient is writhing and screaming out in pain. Histrionic behavior. Difficult to get an accurate history. Patient supposedly tripped while chasing dog and fell onto his right arm. No other injury. Complaining of pain to the whole arm. No numbness or weakness. Past Medical History  Diagnosis Date  . Stroke    Past Surgical History  Procedure Laterality Date  . Wisdom tooth extraction     History reviewed. No pertinent family history. History  Substance Use Topics  . Smoking status: Current Every Day Smoker -- 0.50 packs/day for 10 years    Types: Cigarettes  . Smokeless tobacco: Never Used  . Alcohol Use: No    Review of Systems  Musculoskeletal: Positive for arthralgias. Negative for neck pain.  Neurological: Negative for syncope, weakness, numbness and headaches.  All other systems reviewed and are negative.     Allergies  Review of patient's allergies indicates no known allergies.  Home Medications   Prior to Admission medications   Medication Sig Start Date End Date Taking? Authorizing Provider  doxycycline (VIBRAMYCIN) 100 MG capsule Take 1 capsule (100 mg total) by mouth 2 (two) times daily. One po bid x 10 days 12/25/13   Hurman Horn, MD  EPINEPHrine (EPIPEN) 0.3 mg/0.3 mL SOAJ injection Inject 0.3 mLs (0.3 mg total) into the muscle as needed. 06/07/13   Ethelda Chick, MD  ibuprofen (ADVIL,MOTRIN) 800 MG tablet Take 1 tablet (800 mg total) by mouth every 8 (eight) hours as needed. 08/04/13   Kristen N Ward, DO  penicillin v potassium (VEETID) 500 MG tablet Take 1 tablet (500 mg total) by mouth 3 (three) times daily. 06/26/13   Shari A Upstill, PA-C  penicillin v potassium (VEETID) 500 MG  tablet Take 1 tablet (500 mg total) by mouth 4 (four) times daily. 08/04/13   Kristen N Ward, DO  traMADol (ULTRAM) 50 MG tablet Take 1 tablet (50 mg total) by mouth every 6 (six) hours as needed. 06/26/13   Shari A Upstill, PA-C   BP 151/126  Resp 24 Physical Exam  Nursing note and vitals reviewed. Constitutional: He is oriented to person, place, and time. He appears well-developed and well-nourished. No distress.  Histrionic behavior  HENT:  Head: Normocephalic and atraumatic.  Mouth/Throat: Oropharynx is clear and moist.  No obvious head injury.  Eyes: EOM are normal. Pupils are equal, round, and reactive to light.  Neck: Normal range of motion. Neck supple.  A posterior midline cervical tenderness to palpation.  Cardiovascular: Normal rate and regular rhythm.   Pulmonary/Chest: Effort normal and breath sounds normal. No respiratory distress. He has no wheezes. He has no rales.  Abdominal: Soft. Bowel sounds are normal. He exhibits no distension and no mass. There is no tenderness. There is no rebound and no guarding.  Musculoskeletal: Normal range of motion. He exhibits no edema and no tenderness.  Physical exam of the right upper extremity due to patient's noncompliance. Appears to have full range of motion of the right shoulder,  right elbow and right wrist when distracted. No obvious wrist deformity. Patient does have a dislocated fifth digit of the right finger. Difficult to assess. Cap refill. Radial pulses intact.  Neurological: He is alert and oriented to person,  place, and time.  Writhing in bed. Moving all extremities without any focal deficit. Sensation grossly intact.  Skin: Skin is warm and dry. No rash noted. No erythema.  Psychiatric:  Histrionic. Agitated.    ED Course  Procedures (including critical care time) Labs Review Labs Reviewed - No data to display  Imaging Review Dg Forearm Right  04/01/2014   CLINICAL DATA:  Injury  EXAM: RIGHT FOREARM - 2 VIEW   COMPARISON:  None.  FINDINGS: There is no evidence of fracture or other focal bone lesions. Soft tissues are unremarkable.  IMPRESSION: Negative.   Electronically Signed   By: Rise MuBenjamin  McClintock M.D.   On: 04/01/2014 05:56   Dg Hand Complete Right  04/01/2014   CLINICAL DATA:  ARM INJURY  EXAM: RIGHT HAND - COMPLETE 3+ VIEW  COMPARISON:  None.  FINDINGS: There is an acute dislocation of the right fifth DIP joint, with the distal phalanx dorsally dislocated. No associated fracture. No other acute abnormality within the hand. No soft tissue abnormality. No retained foreign body.  IMPRESSION: Dorsal dislocation of the right fifth PIP joint. No associated fracture.   Electronically Signed   By: Rise MuBenjamin  McClintock M.D.   On: 04/01/2014 05:55     EKG Interpretation None      MDM   Final diagnoses:  None   X-ray reveals dislocated right fifth PCP joint. No fractures. Patient wasn't happy that he was given oral pain medication. While waiting for a final radiology read the patient eloped kicking the door the way out. [facility] No return precautions were given. No AMA was filed.     Loren Raceravid Shayden Bobier, MD 04/01/14 [phone]

## 2014-04-01 NOTE — Discharge Instructions (Signed)
Finger Dislocation Finger dislocation is the displacement of bones in your finger at the joints. Most commonly, finger dislocation occurs at the proximal interphalangeal joint (the joint closest to your knuckle). Very strong, fibrous tissues (ligaments) and joint capsules connect the three bones of your fingers.  CAUSES Dislocation is caused by a forceful impact. This impact moves these bones off the joint and often tears your ligaments.  SYMPTOMS Symptoms of finger dislocation include:  Deformity of your finger.  Pain, with loss of movement. DIAGNOSIS  Finger dislocation is diagnosed with a physical exam. Often, X-ray exams are done to see if you have associated injuries, such as bone fractures. TREATMENT  Finger dislocations are treated by putting your bones back into position (reduction) either by manually moving the bones back into place or through surgery. Your finger is then kept in a fixed position (immobilized) with the use of a dressing or splint for a brief period. When your ligament has to be surgically repaired, it needs to be kept in a fixed position with a dressing or splint for 1 to 2 weeks. Because joint stiffness is a long-term complication of finger dislocation, hand exercises or physical therapy to increase the range of motion and to regain strength is usually started as soon as the ligament is healed. Exercises and therapy generally last no more than 3 months. HOME CARE INSTRUCTIONS The following measures can help to reduce pain and speed up the healing process:  Rest your injured joint. Do not move until instructed otherwise by your caregiver. Avoid activities similar to the one that caused your injury.  Apply ice to your injured joint for the first day or 2 after your reduction or as directed by your caregiver. Applying ice helps to reduce inflammation and pain.  Put ice in a plastic bag.  Place a towel between your skin and the bag.  Leave the ice on for 15-20 minutes  at a time, every 2 hours while you are awake.  Elevate your hand above your heart as directed by your caregiver to reduce swelling.  Take over-the-counter or prescription medicine for pain as your caregiver instructs you. SEEK IMMEDIATE MEDICAL CARE IF:  Your dressing or splint becomes damaged.  Your pain becomes worse rather than better.  You lose feeling in your finger, or it becomes cold and white. MAKE SURE YOU:  Understand these instructions.  Will watch your condition.  Will get help right away if you are not doing well or get worse. Document Released: 07/25/2000 Document Revised: 10/20/2011 Document Reviewed: 05/18/2011 [facility] Patient Information 2015 [place], [place]. This information is not intended to replace advice given to you by your health care provider. Make sure you discuss any questions you have with your health care provider.   Thoracic Strain You have injured the muscles or tendons that attach to the upper part of your back behind your chest. This injury is called a thoracic strain, thoracic sprain, or mid-back strain.  CAUSES  The cause of thoracic strain varies. A less severe injury involves pulling a muscle or tendon without tearing it. A more severe injury involves tearing (rupturing) a muscle or tendon. With less severe injuries, there may be little loss of strength. Sometimes, there are breaks (fractures) in the bones to which the muscles are attached. These fractures are rare, unless there was a direct hit (trauma) or you have weak bones due to osteoporosis or age. Longstanding strains may be caused by overuse or improper form during certain movements. Obesity can  also increase your risk for back injuries. Sudden strains may occur due to injury or not warming up properly before exercise. Often, there is no obvious cause for a thoracic strain. SYMPTOMS  The main symptom is pain, especially with movement, such as during exercise. DIAGNOSIS  Your caregiver can  usually tell what is wrong by taking an X-ray and doing a physical exam. TREATMENT   Physical therapy may be helpful for recovery. Your caregiver can give you exercises to do or refer you to a physical therapist after your pain improves.  After your pain improves, strengthening and conditioning programs appropriate for your sport or occupation may be helpful.  Always warm up before physical activities or athletics. Stretching after physical activity may also help.  Certain over-the-counter medicines may also help. Ask your caregiver if there are medicines that would help you. If this is your first thoracic strain injury, proper care and proper healing time before starting activities should prevent long-term problems. Torn ligaments and tendons require as long to heal as broken bones. Average healing times may be only 1 week for a mild strain. For torn muscles and tendons, healing time may be up to 6 weeks to 2 months. HOME CARE INSTRUCTIONS   Apply ice to the injured area. Ice massages may also be used as directed.  Put ice in a plastic bag.  Place a towel between your skin and the bag.  Leave the ice on for 15-20 minutes, 03-04 times a day, for the first 2 days.  Only take over-the-counter or prescription medicines for pain, discomfort, or fever as directed by your caregiver.  Keep your appointments for physical therapy if this was prescribed.  Use wraps and back braces as instructed. SEEK IMMEDIATE MEDICAL CARE IF:   You have an increase in bruising, swelling, or pain.  Your pain has not improved with medicines.  You develop new shortness of breath, chest pain, or fever.  Problems seem to be getting worse rather than better. MAKE SURE YOU:   Understand these instructions.  Will watch your condition.  Will get help right away if you are not doing well or get worse. Document Released: 10/18/2003 Document Revised: 10/20/2011 Document Reviewed: 09/13/2010 [facility] Patient  Information 2015 [place], [place]. This information is not intended to replace advice given to you by your health care provider. Make sure you discuss any questions you have with your health care provider.

## 2014-04-16 ENCOUNTER — Emergency Department (HOSPITAL_COMMUNITY): Payer: No Typology Code available for payment source

## 2014-04-16 ENCOUNTER — Encounter (HOSPITAL_COMMUNITY): Payer: Self-pay | Admitting: Emergency Medicine

## 2014-04-16 ENCOUNTER — Emergency Department (HOSPITAL_COMMUNITY)
Admission: EM | Admit: 2014-04-16 | Discharge: 2014-04-16 | Disposition: A | Discharge status: Home / Self Care | Payer: No Typology Code available for payment source | Attending: Emergency Medicine | Admitting: Emergency Medicine

## 2014-04-16 DIAGNOSIS — R6884 Jaw pain: Secondary | ICD-10-CM

## 2014-04-16 DIAGNOSIS — S46909A Unspecified injury of unspecified muscle, fascia and tendon at shoulder and upper arm level, unspecified arm, initial encounter: Secondary | ICD-10-CM | POA: Insufficient documentation

## 2014-04-16 DIAGNOSIS — S199XXA Unspecified injury of neck, initial encounter: Principal | ICD-10-CM

## 2014-04-16 DIAGNOSIS — S0993XA Unspecified injury of face, initial encounter: Secondary | ICD-10-CM | POA: Insufficient documentation

## 2014-04-16 DIAGNOSIS — F17200 Nicotine dependence, unspecified, uncomplicated: Secondary | ICD-10-CM | POA: Insufficient documentation

## 2014-04-16 DIAGNOSIS — Z8673 Personal history of transient ischemic attack (TIA), and cerebral infarction without residual deficits: Secondary | ICD-10-CM | POA: Insufficient documentation

## 2014-04-16 DIAGNOSIS — S6990XA Unspecified injury of unspecified wrist, hand and finger(s), initial encounter: Secondary | ICD-10-CM | POA: Insufficient documentation

## 2014-04-16 DIAGNOSIS — S4980XA Other specified injuries of shoulder and upper arm, unspecified arm, initial encounter: Secondary | ICD-10-CM | POA: Insufficient documentation

## 2014-04-16 DIAGNOSIS — Z792 Long term (current) use of antibiotics: Secondary | ICD-10-CM | POA: Insufficient documentation

## 2014-04-16 MED ORDER — FENTANYL CITRATE 0.05 MG/ML IJ SOLN
50.0000 ug | Freq: Once | INTRAMUSCULAR | Status: AC
  Administered 2014-04-16: 50 ug via INTRAVENOUS
  Filled 2014-04-16: qty 2

## 2014-04-16 MED ORDER — OXYCODONE-ACETAMINOPHEN 5-325 MG PO TABS: 1.0000 | ORAL_TABLET | Freq: Once | ORAL | Status: DC

## 2014-04-16 MED ORDER — SODIUM CHLORIDE 0.9 % IV BOLUS (SEPSIS)
1000.0000 mL | INTRAVENOUS | Status: AC
  Administered 2014-04-16: 1000 mL via INTRAVENOUS
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
## 2014-04-16 NOTE — ED Notes (Signed)
Bed: RESB Expected date:  Expected time:  Means of arrival:  Comments: EMS ETOH ?fx mandible?
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
## 2014-07-22 ENCOUNTER — Encounter (HOSPITAL_COMMUNITY): Payer: Self-pay | Admitting: *Deleted

## 2014-07-22 ENCOUNTER — Inpatient Hospital Stay (HOSPITAL_COMMUNITY)
Admission: AD | Admit: 2014-07-22 | Discharge: 2014-07-23 | DRG: 065 | Discharge status: Against Medical Advice | Payer: Self-pay | Source: Other Acute Inpatient Hospital | Attending: Neurology | Admitting: Neurology

## 2014-07-22 DIAGNOSIS — F1410 Cocaine abuse, uncomplicated: Secondary | ICD-10-CM | POA: Diagnosis present

## 2014-07-22 DIAGNOSIS — E785 Hyperlipidemia, unspecified: Secondary | ICD-10-CM | POA: Diagnosis present

## 2014-07-22 DIAGNOSIS — G8191 Hemiplegia, unspecified affecting right dominant side: Secondary | ICD-10-CM | POA: Diagnosis present

## 2014-07-22 DIAGNOSIS — Z5329 Procedure and treatment not carried out because of patient's decision for other reasons: Secondary | ICD-10-CM | POA: Diagnosis present

## 2014-07-22 DIAGNOSIS — I10 Essential (primary) hypertension: Secondary | ICD-10-CM | POA: Diagnosis present

## 2014-07-22 DIAGNOSIS — Z8673 Personal history of transient ischemic attack (TIA), and cerebral infarction without residual deficits: Secondary | ICD-10-CM

## 2014-07-22 DIAGNOSIS — F1910 Other psychoactive substance abuse, uncomplicated: Secondary | ICD-10-CM | POA: Diagnosis present

## 2014-07-22 DIAGNOSIS — Z0000 Encounter for general adult medical examination without abnormal findings: Secondary | ICD-10-CM

## 2014-07-22 DIAGNOSIS — I639 Cerebral infarction, unspecified: Principal | ICD-10-CM | POA: Diagnosis present

## 2014-07-22 DIAGNOSIS — R2990 Unspecified symptoms and signs involving the nervous system: Secondary | ICD-10-CM

## 2014-07-22 DIAGNOSIS — F14120 Cocaine abuse with intoxication, uncomplicated: Secondary | ICD-10-CM

## 2014-07-22 DIAGNOSIS — F1010 Alcohol abuse, uncomplicated: Secondary | ICD-10-CM | POA: Diagnosis present

## 2014-07-22 DIAGNOSIS — Z6827 Body mass index (BMI) 27.0-27.9, adult: Secondary | ICD-10-CM

## 2014-07-22 DIAGNOSIS — I63312 Cerebral infarction due to thrombosis of left middle cerebral artery: Secondary | ICD-10-CM

## 2014-07-22 LAB — CBC
HCT: 49.1 % (ref 39.0–52.0)
HEMOGLOBIN: 16.6 g/dL (ref 13.0–17.0)
MCH: 30.7 pg (ref 26.0–34.0)
MCHC: 33.8 g/dL (ref 30.0–36.0)
MCV: 90.9 fL (ref 78.0–100.0)
Platelets: 381 10*3/uL (ref 150–400)
RBC: 5.4 MIL/uL (ref 4.22–5.81)
RDW: 14.3 % (ref 11.5–15.5)
WBC: 8.7 10*3/uL (ref 4.0–10.5)

## 2014-07-22 LAB — CREATININE, SERUM
Creatinine, Ser: 0.88 mg/dL (ref 0.50–1.35)
GFR calc Af Amer: >90 mL/min (ref >90)
GFR calc non Af Amer: >90 mL/min (ref >90)

## 2014-07-22 MED ORDER — ENOXAPARIN SODIUM 30 MG/0.3ML ~~LOC~~ SOLN
30.0000 mg | SUBCUTANEOUS | Status: DC
  Administered 2014-07-22: 30 mg via SUBCUTANEOUS
  Filled 2014-07-22: qty 0.3

## 2014-07-22 MED ORDER — THIAMINE HCL 100 MG/ML IJ SOLN: 100.0000 mg | Freq: Every day | INTRAMUSCULAR | Status: DC

## 2014-07-22 MED ORDER — BUTALBITAL-APAP-CAFFEINE 50-325-40 MG PO TABS
1.0000 | ORAL_TABLET | ORAL | Status: DC | PRN
  Administered 2014-07-22 – 2014-07-23 (×2): 1 via ORAL
  Filled 2014-07-22 (×2): qty 1

## 2014-07-22 MED ORDER — VITAMIN B-1 100 MG PO TABS
100.0000 mg | ORAL_TABLET | Freq: Every day | ORAL | Status: DC
  Administered 2014-07-22 – 2014-07-23 (×2): 100 mg via ORAL
  Filled 2014-07-22 (×2): qty 1

## 2014-07-22 MED ORDER — LORAZEPAM 1 MG PO TABS: 1.0000 mg | ORAL_TABLET | Freq: Four times a day (QID) | ORAL | Status: DC | PRN

## 2014-07-22 MED ORDER — STROKE: EARLY STAGES OF RECOVERY BOOK
Freq: Once | Status: AC
  Administered 2014-07-22: 1
  Filled 2014-07-22: qty 1

## 2014-07-22 MED ORDER — FOLIC ACID 1 MG PO TABS
1.0000 mg | ORAL_TABLET | Freq: Every day | ORAL | Status: DC
  Administered 2014-07-22 – 2014-07-23 (×2): 1 mg via ORAL
  Filled 2014-07-22 (×2): qty 1

## 2014-07-22 MED ORDER — ASPIRIN 300 MG RE SUPP: 300.0000 mg | Freq: Every day | RECTAL | Status: DC

## 2014-07-22 MED ORDER — KETOROLAC TROMETHAMINE 15 MG/ML IJ SOLN
15.0000 mg | Freq: Four times a day (QID) | INTRAMUSCULAR | Status: DC
  Administered 2014-07-22 – 2014-07-23 (×4): 15 mg via INTRAVENOUS
  Filled 2014-07-22 (×4): qty 1

## 2014-07-22 MED ORDER — ADULT MULTIVITAMIN W/MINERALS CH
1.0000 | ORAL_TABLET | Freq: Every day | ORAL | Status: DC
  Administered 2014-07-22 – 2014-07-23 (×2): 1 via ORAL
  Filled 2014-07-22 (×2): qty 1

## 2014-07-22 MED ORDER — LORAZEPAM 2 MG/ML IJ SOLN
1.0000 mg | Freq: Four times a day (QID) | INTRAMUSCULAR | Status: DC | PRN
Start: 2014-07-22 — End: 2014-07-23
  Administered 2014-07-22: 1 mg via INTRAVENOUS
  Filled 2014-07-22: qty 1

## 2014-07-22 MED ORDER — ASPIRIN 325 MG PO TABS
325.0000 mg | ORAL_TABLET | Freq: Every day | ORAL | Status: DC
  Administered 2014-07-22 – 2014-07-23 (×2): 325 mg via ORAL
  Filled 2014-07-22 (×2): qty 1
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
## 2014-07-22 NOTE — Progress Notes (Signed)
[facility] rec[facility]. Will monitor for pt's arrival to unit  Andrew AuVafiadis, Micaylah Bertucci I  07/22/2014 3:05 PM
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
## 2014-07-23 ENCOUNTER — Inpatient Hospital Stay (HOSPITAL_COMMUNITY): Payer: Self-pay

## 2014-07-23 DIAGNOSIS — R2990 Unspecified symptoms and signs involving the nervous system: Secondary | ICD-10-CM

## 2014-07-23 DIAGNOSIS — I517 Cardiomegaly: Secondary | ICD-10-CM

## 2014-07-23 DIAGNOSIS — F14120 Cocaine abuse with intoxication, uncomplicated: Secondary | ICD-10-CM

## 2014-07-23 DIAGNOSIS — I63032 Cerebral infarction due to thrombosis of left carotid artery: Secondary | ICD-10-CM

## 2014-07-23 DIAGNOSIS — F1910 Other psychoactive substance abuse, uncomplicated: Secondary | ICD-10-CM

## 2014-07-23 DIAGNOSIS — I639 Cerebral infarction, unspecified: Principal | ICD-10-CM

## 2014-07-23 LAB — LIPID PANEL
CHOL/HDL RATIO: 3.2 ratio
CHOLESTEROL: 174 mg/dL (ref 0–200)
HDL: 55 mg/dL (ref >39)
LDL Cholesterol: 89 mg/dL (ref 0–99)
Triglycerides: 150 mg/dL — ABNORMAL HIGH (ref <150)
VLDL: 30 mg/dL (ref 0–40)

## 2014-07-23 LAB — HEMOGLOBIN A1C
Hgb A1c MFr Bld: 5.4 % (ref <5.7)
MEAN PLASMA GLUCOSE: 108 mg/dL (ref <117)

## 2014-07-23 MED ORDER — LORAZEPAM 2 MG/ML IJ SOLN
1.0000 mg | Freq: Once | INTRAMUSCULAR | Status: AC
  Administered 2014-07-23: 1 mg via INTRAVENOUS
  Filled 2014-07-23: qty 1

## 2014-07-23 MED ORDER — ATORVASTATIN CALCIUM 10 MG PO TABS: 10.0000 mg | ORAL_TABLET | Freq: Every day | ORAL | Status: DC

## 2014-07-23 MED ORDER — ENOXAPARIN SODIUM 40 MG/0.4ML ~~LOC~~ SOLN: 40.0000 mg | Freq: Every day | SUBCUTANEOUS | Status: DC

## 2014-07-23 MED ORDER — LORAZEPAM 2 MG/ML IJ SOLN: 0.5000 mg | Freq: Once | INTRAMUSCULAR | Status: DC | PRN

## 2014-07-23 NOTE — Progress Notes (Signed)
PT Cancellation Note  Patient Details Name: Jacob [name] MRN: [id number] DOB: 02/09/1986   Cancelled Treatment:    Reason Eval/Treat Not Completed: Medical issues which prohibited therapy.  Patient is on bedrest per orders.  MD:  Please write activity orders when appropriate for patient.  PT will initiate evaluation at that time.  Thank you!   [name], Tiarah Shisler H 07/23/2014, 3:13 PM Durenda HurtSusan H. Renaldo Fiddleravis, PT, [facility] Acute Rehab Services Pager [phone]

## 2014-07-23 NOTE — Progress Notes (Signed)
STROKE TEAM PROGRESS NOTE   HISTORY Jacob [name] is an 28 y.o. male with a history of polysubstance abuse including alcohol and cocaine as well as at least 2 previous strokes thought to be okay and related, transferred [facility] emergency [facility] management of acute recurrent stroke. It's unclear when patient was last known well. He managed to drive himself to the emergency room at [facility] and was noted to be crawling towards the entrance. Patient admits to using cocaine last night in addition to drinking alcohol. His alcohol level was 0.2. CT scan of his head showed no acute hemorrhage nor intracranial abnormality otherwise. CTA of head and neck were also obtained. Results are unavailable at this point. Patient showed some initial signs of improvement with ability to move his right arm but subsequently came flaccid again. He has been somewhat confused and agitated. NIH stroke score was 16.  LSN: Unclear tPA Given: No: Unknown when last seen well mRankin:   SUBJECTIVE (INTERVAL HISTORY) The patient's fianc is at the bedside. The patient states that he was "partying" when symptoms develop. He was using alcohol, cocaine, and some other unknown substances. Dr. Pearlean BrownieSethi discussed the dangers of cocaine and other illegal drugs. 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 Dr. Pearlean BrownieSethi also advised against steroids and excessive energy drinks. The patient is still having visual problems in his right eye right-sided weakness with hemisensory deficits as well as some numbness on the left side of his body.   OBJECTIVE Temp:  [97.7 F (36.5 C)-98.6 F (37 C)] 97.7 F (36.5 C) (12/13 1415) Pulse Rate:  [70-98] 70 (12/13 1415) Cardiac Rhythm:  [-] Other (Comment) (12/13 0800) Resp:  [18-20] 20 (12/13 1415) BP: (131-158)/(83-92) 139/83 mmHg (12/13 1415) SpO2:  [97 %-100 %] 100 % (12/13 1415) Weight:  [199 lb 11.8 oz (90.6 kg)] 199 lb 11.8 oz (90.6 kg) (12/12  1500)  No results for input(s): GLUCAP in the last 168 hours.  Recent Labs Lab 07/22/14 1633  CREATININE 0.88   No results for input(s): AST, ALT, ALKPHOS, BILITOT, PROT, ALBUMIN in the last 168 hours.  Recent Labs Lab 07/22/14 1633  WBC 8.7  HGB 16.6  HCT 49.1  MCV 90.9  PLT 381   No results for input(s): CKTOTAL, CKMB, CKMBINDEX, TROPONINI in the last 168 hours. No results for input(s): LABPROT, INR in the last 72 hours. No results for input(s): COLORURINE, LABSPEC, PHURINE, GLUCOSEU, HGBUR, BILIRUBINUR, KETONESUR, PROTEINUR, UROBILINOGEN, NITRITE, LEUKOCYTESUR in the last 72 hours.  Invalid input(s): APPERANCEUR     Component Value Date/Time   CHOL 174 07/23/2014 0550   TRIG 150* 07/23/2014 0550   HDL 55 07/23/2014 0550   CHOLHDL 3.2 07/23/2014 0550   VLDL 30 07/23/2014 0550   LDLCALC 89 07/23/2014 0550   No results found for: HGBA1C No results found for: LABOPIA, COCAINSCRNUR, LABBENZ, AMPHETMU, THCU, LABBARB  No results for input(s): ETH in the last 168 hours.  Dg Chest 2 View 07/23/2014    Normal chest x-ray.     Mri / Mra Brain Wo Contrast 07/23/2014     Some motion degradation. Allowing for that, normal appearance of the brain. No evidence of old or acute infarction. No abnormality of the intracranial vasculature demonstrated.       PHYSICAL EXAM Young muscular Caucasian male sitting up on the bed not in distress.Awake alert. Afebrile. Head is nontraumatic. Neck is supple without bruit. Hearing is normal. Cardiac exam no murmur  or gallop. Lungs are clear to auscultation. Distal pulses are well felt. Neurological Exam : Awake alert oriented 3 with normal speech and language. Fundi were not visualized. Subjective diminished visual acuity to 2 feet only in the right eye and normal on the left. Extraocular movements are full range without nystagmus. Face is asymmetric with mild right lower facial weakness. Tongue midline. Motor system exam subjective right  hemiparesis with patient complaining of shoulder pain. Shoulder elevation. But there appears to be mild subjective giveaway of right grip and intrinsic hand muscles as well. Similarly mild subjective weakness of right hip flexors. Subjective diminished touch and pinprick sensation on the right hemibody including the forehead. Subjective decrease in vibration in the right hemibody including forehead. Deep tendon reflexes are symmetric. Onto the downgoing. Gait was not tested.  ASSESSMENT/PLAN Mr. Jacob [name] is a 28 y.o. male with history of polysubstance abuse including tobacco, cocaine, anabolic steroids, a history of 2 previous strokes, and alcohol [facility] with right hemiparesis and bilateral hemisensory deficits. He did not receive IV t-PA as times onset was unknown.  Patient with subjective right-sided weakness and hemibody sensory loss in the setting of multiple drug toxicity with negative neurovascular evaluation and brain imaging study. Symptoms cannot be explained on neurovascular basis. Neurological exam does have some nonorganic features  Resultant  Right eye vision loss and right hemiparesis  MRI - no acute findings  MRA  - no significant stenosis  Carotid Doppler - 1-39% ICA stenosis. Vertebral artery flow is antegrade.  2D Echo - ejection fraction 50-55%. No cardiac source of emboli identified.  LDL - 89  HgbA1c - pending  Lovenox for VTE prophylaxis  Diet regular with thin liquids  No anti-thrombotics prior to admission, now on aspirin 325 mg orally every day  Ongoing aggressive stroke risk factor management  Therapy recommendations:  Pending  Disposition:  Pending  Hypertension  Home meds: No antihypertensive medications prior to admission  Mildly high at times  Hyperlipidemia  Home meds: No statin medications prior to admission  LDL 89, goal < 70  Add low-dose statin  Continue statin at  discharge    Other Stroke Risk Factors   Cigarette smoker, advised to stop smoking  ETOH use  Hx stroke/TIA  History of cocaine use   Other Active Problems  Polysubstance abuse including steroids  Other Pertinent History  Patient should not drive due to visual deficits  Hospital day # 1  Delton Seeavid Rinehuls PA-C [facility] Pager [phone] 07/23/2014, 2:26 PM  I have personally examined this patient, reviewed notes, independently viewed imaging studies, participated in medical decision making and plan of care. I have made any additions or clarifications directly to the above note. Agree with note above. The patient was admitted with alcohol and multidrug intoxication. He had focal deficits which have not completely resolved but neurovascular and brain imaging show no definite abnormality. His right eye vision loss is difficult to explain and perhaps related to toxicity. I will ask Dr. Noel Christmasharles Stewart neuro hospitalist to take over further neurological follow-up.  Delia HeadyPramod Sanyla Summey, MD Medical Director [facility] Pager: [phone] 07/23/2014 3:51 PM    To contact Stroke Continuity provider, please refer to [email or web address]. After hours, contact General Neurology

## 2014-07-23 NOTE — Progress Notes (Signed)
Physical Therapy Evaluation Patient Details Name: Jacob [name] MRN: [id number] DOB: 05/07/1986 Today's Date: 07/23/2014   History of Present Illness  Patient is a 28 yo male admitted 07/22/14 with slurred speech and Rt hemiparesis. NIHSS = 16.  PMH:  polysubstance abuse, prior CVA's.  Clinical Impression  Patient presents with problems listed below.  Would benefit from acute PT to maximize functional independence prior to discharge.   Patient sitting on couch getting dressed as PT entered room.  Patient placed call to ask where his car is located, stating he might leave today.  Spoke to patient about importance of completing medical work-up and following up with PT.  Contacted RN following session to inform her of patient's actions and comments.    Follow Up Recommendations Outpatient PT;Supervision for mobility/OOB    Equipment Recommendations  None recommended by PT    Recommendations for Other Services       Precautions / Restrictions Precautions Precautions: Fall Restrictions Weight Bearing Restrictions: No      Mobility  Bed Mobility               General bed mobility comments: Patient sitting on couch as PT entered room.  Patient with jeans on, and putting on shirt and socks.  Transfers Overall transfer level: Modified independent Equipment used: None             General transfer comment: Patient able to move sit <> stand without assistance.  Balances in stance with weight shifted to LLE.  Ambulation/Gait Ambulation/Gait assistance: Modified independent (Device/Increase time) Ambulation Distance (Feet): 30 Feet Assistive device: None Gait Pattern/deviations: Step-to pattern;Decreased stance time - right;Decreased step length - left;Decreased dorsiflexion - right;Decreased weight shift to right   Gait velocity interpretation: Below normal speed for age/gender General Gait Details: Patient with poor gait pattern, dragging RLE with decreased DF and hip  flexion with RLE swing phase.  Patient steps forward with LLE and then drags RLE up to the left one.  Patient without loss of balance during gait.  Has been up in room on his own against advice of RN.  Stairs            Wheelchair Mobility    Modified Rankin (Stroke Patients Only) Modified Rankin (Stroke Patients Only) Pre-Morbid Rankin Score: No symptoms Modified Rankin: Moderate disability     Balance                                             Pertinent Vitals/Pain Pain Assessment: 0-10 Pain Score: 9  Pain Location: Headache Pain Descriptors / Indicators: Headache Pain Intervention(s): Patient requesting pain meds-RN notified    Home Living Family/patient expects to be discharged to:: Private residence Living Arrangements: Spouse/significant other Available Help at Discharge: Family;Available 24 hours/day Type of Home: House Home Access: Stairs to enter Entrance Stairs-Rails: Doctor, general practiceight;Left Entrance Stairs-Number of Steps: 3 Home Layout: One level Home Equipment: Other (comment) ("walking stick")      Prior Function Level of Independence: Independent               Hand Dominance        Extremity/Trunk Assessment   Upper Extremity Assessment: RUE deficits/detail RUE Deficits / Details: Decreased strength: Shoulder movement at 2+/5; Elbow flexion at 2/5, grip/hand movement 3/5   RUE Sensation: decreased light touch (Tingling)     Lower Extremity Assessment: RLE deficits/detail RLE Deficits /  Details: Decreased strength:  Hip flexion at 3-/5; Knee extension at 3-/5, DF at 3/5, PF at 2/5.    Cervical / Trunk Assessment: Other exceptions  Communication   Communication: No difficulties  Cognition Arousal/Alertness: Awake/alert Behavior During Therapy: Impulsive;Restless Overall Cognitive Status: Difficult to assess Area of Impairment: Orientation;Attention;Safety/judgement Orientation Level: Disoriented to;Time Current Attention  Level: Selective (Easily distracted)     Safety/Judgement: Decreased awareness of deficits;Decreased awareness of safety     General Comments: During session, patient makes a phone call, asking person where his car is located.  Difficulty focusing on PT.    General Comments      Exercises        Assessment/Plan    PT Assessment Patient needs continued PT services  PT Diagnosis Difficulty walking;Abnormality of gait;Hemiplegia dominant side   PT Problem List Decreased strength;Decreased mobility;Decreased coordination;Decreased safety awareness;Decreased knowledge of use of DME;Impaired sensation;Pain  PT Treatment Interventions DME instruction;Gait training;Stair training;Functional mobility training;Therapeutic activities;Balance training;Patient/family education   PT Goals (Current goals can be found in the Care Plan section) Acute Rehab PT Goals Patient Stated Goal: To get home PT Goal Formulation: With patient Time For Goal Achievement: 07/30/14 Potential to Achieve Goals: Good    Frequency Min 4X/week   Barriers to discharge        Co-evaluation               End of Session   Activity Tolerance: Patient tolerated treatment well;Treatment limited secondary to agitation (Patient focused on getting out of hospital) Patient left: in bed;with call bell/phone within reach Nurse Communication: Mobility status (Pt talking about leaving hospital. Walking in room alone. )         Time: 5784-6962: 1553-1608 PT Time Calculation (min) (ACUTE ONLY): 15 min   Charges:   PT Evaluation $Initial PT Evaluation Tier I: 1 Procedure PT Treatments $Gait Training: 8-22 mins   PT G Codes:          [name], Fabiola Mudgett H 07/23/2014, 5:00 PM Durenda HurtSusan H. Renaldo Fiddleravis, PT, [facility] Acute Rehab Services Pager [phone]

## 2014-07-23 NOTE — Progress Notes (Addendum)
This nurse was called by PT because patient was c/o 9/10 headache and he was getting himself dressed and talking about leaving the hospital.  This nurse retrieved the patient some pain medication and entered his room.  Upon entrance the patient was dressed and lying on his bed.  He took the medication for his headache and he asked if his IV could be removed because we were no longer using it for anything.  I explained to him that he has to keep the IV until he is discharged in the event we needed it for anything.  He then asked if he could sign himself out of here.  I explained to him the importance of him being here in the hospital and finishing up his tests but he could sign himself out AMA.  He said that he wanted to go.  I then told him I would be right back with the paperwork for him to sign and to take out his IV.  I left his room to get the papers from the front desk and received a phone call that he had went down the stairs.  Pt IV still in his arm.  MD notified.   Sondra ComeSilva, Mariann Palo M, RN

## 2014-07-23 NOTE — Progress Notes (Signed)
VASCULAR LAB PRELIMINARY  PRELIMINARY  PRELIMINARY  PRELIMINARY  Carotid Dopplers completed.    Preliminary report:  1-39% ICA stenosis. Vertebral artery flow is antegrade.  Zahraa Bhargava, RVT 07/23/2014, 9:14 AM

## 2014-07-23 NOTE — Progress Notes (Signed)
Echo Lab  2D Echocardiogram completed.  Joshaua Epple L Jalani Cullifer, RDCS 07/23/2014 9:20 AM

## 2014-07-23 NOTE — Discharge Summary (Signed)
Stroke Discharge Summary  Patient ID: Jacob [name]    l   MRN: [id number]      DOB: 10/29/1985  Date of Admission: 07/22/2014 Date of Discharge: 07/23/2014  Attending Physician:  No att. providers found, Stroke MD  Consulting Physician(s):   Treatment Team:  Noel Christmasharles Stewart neurology right hemiparesis Patient's PCP:  No PCP Per Patient  DISCHARGE DIAGNOSIS: Patient signed out AMA. Stroke ruled out.  Active Problems:   CVA (cerebral infarction)   Stroke-like episode   Polysubstance abuse   Cocaine abuse with intoxication and without complication  BMI: Body mass index is 27.08 kg/(m^2).  No past medical history on file. No past surgical history on file.    Medication List    ASK your doctor about these medications        ibuprofen 200 MG tablet  Commonly known as:  ADVIL,MOTRIN  Take 200 mg by mouth every 6 (six) hours as needed.        LABORATORY STUDIES CBC    Component Value Date/Time   WBC 8.7 07/22/2014 1633   RBC 5.40 07/22/2014 1633   HGB 16.6 07/22/2014 1633   HCT 49.1 07/22/2014 1633   PLT 381 07/22/2014 1633   MCV 90.9 07/22/2014 1633   MCH 30.7 07/22/2014 1633   MCHC 33.8 07/22/2014 1633   RDW 14.3 07/22/2014 1633   CMP    Component Value Date/Time   CREATININE 0.88 07/22/2014 1633   GFRNONAA >90 07/22/2014 1633   GFRAA >90 07/22/2014 1633   COAGSNo results found for: INR, PROTIME Lipid Panel    Component Value Date/Time   CHOL 174 07/23/2014 0550   TRIG 150* 07/23/2014 0550   HDL 55 07/23/2014 0550   CHOLHDL 3.2 07/23/2014 0550   VLDL 30 07/23/2014 0550   LDLCALC 89 07/23/2014 0550   HgbA1C No results found for: HGBA1C Cardiac Panel (last 3 results) No results for input(s): CKTOTAL, CKMB, TROPONINI, RELINDX in the last 72 hours. Urinalysis No results found for: COLORURINE, APPEARANCEUR, LABSPEC, PHURINE, GLUCOSEU, HGBUR, BILIRUBINUR, KETONESUR, PROTEINUR, UROBILINOGEN, NITRITE, LEUKOCYTESUR Urine Drug Screen No results found for:  LABOPIA, COCAINSCRNUR, LABBENZ, AMPHETMU, THCU, LABBARB  Alcohol Level No results found for: ETH   SIGNIFICANT DIAGNOSTIC STUDIES  Carotid Doppler - 1-39% ICA stenosis. Vertebral artery flow is antegrade . 2D Echo - ejection fraction 50-55%. No cardiac source of emboli identified.  Dg Chest 2 View 07/23/2014  Normal chest x-ray.   Mri / Mra Brain Wo Contrast 07/23/2014  Some motion degradation. Allowing for that, normal appearance of the brain. No evidence of old or acute infarction. No abnormality of the intracranial vasculature demonstrated.     HISTORY OF PRESENT ILLNESS  Jacob [name] is an 28 y.o. male with a history of polysubstance abuse including alcohol and cocaine as well as a history of at least 2 previous strokes who transferred [facility] for management of an acute recurrent stroke. It's unclear when patient was last known well. He managed to drive himself to the emergency room at [facility] and was noted to be crawling towards the entrance. Patient admits to using cocaine the night prior to admission in addition to drinking alcohol. His alcohol level was 0.2. CT scan of his head showed no acute hemorrhage nor intracranial abnormality. CTA of head and neck were also obtained. Results are unavailable at this point. Patient showed some initial signs of improvement with ability to move his right arm but it subsequently became flaccid again. He had  been somewhat confused and agitated. NIH stroke score was 16. He admitted to use of multiple agents for intoxication including alcohol and other agents he was unable to name. He also admitted to using steroids for muscle bulkening.  LSN: Unclear tPA Given: No: Unknown when last seen well  HOSPITAL COURSE The patient was [facility] with right hemiparesis and right as well as left hemisensory deficits. He also noted visual difficulties affecting his right eye. An  MRI/MRA of the brain performed on 07/23/2014. The study was affected by motion degradation; however, there was no evidence for old or acute infarcts. The MRA showed no significant abnormalities in the vasculature.   The patient was seen on rounds on 07/23/2014. At that time his fiance was present. The patient freely admitted to using cocaine as well as other recreational drugs and alcohol. He further stated that he used steroids and energy drinks for body building. He continued to have right hemiparesis as well as sensory deficits although there were some inconsistencies in his examination. Inpatient therapies were discussed in order to treat his deficits. It was also strongly recommended that he abstain from further polysubstance abuse. He was told that he should not drive until his vision could be further evaluated. The patient expressed understanding and seemed to be agreeable to the plan; however, later that day he signed out AMA. He left through a stairwell before the nurse could remove his IV.  DISCHARGE EXAM Blood pressure 139/83, pulse 70, temperature 97.7 F (36.5 C), temperature source Oral, resp. rate 20, height 6' (1.829 m), weight 199 lb 11.8 oz (90.6 kg), SpO2 100 %.   Young muscular Caucasian male sitting up on the bed not in distress.Awake alert. Afebrile. Head is nontraumatic. Neck is supple without bruit. Hearing is normal. Cardiac exam no murmur or gallop. Lungs are clear to auscultation. Distal pulses are well felt. Neurological Exam : Awake alert oriented 3 with normal speech and language. Fundi were not visualized. Subjective diminished visual acuity to 2 feet only in the right eye and normal on the left. Extraocular movements are full range without nystagmus. Face is asymmetric with mild right lower facial weakness. Tongue midline. Motor system exam subjective right hemiparesis with patient complaining of shoulder pain. Shoulder elevation. But there appears to be mild subjective  giveaway of right grip and intrinsic hand muscles as well. Similarly mild subjective weakness of right hip flexors. Subjective diminished touch and pinprick sensation on the right hemibody including the forehead. Subjective decrease in vibration in the right hemibody including forehead. Deep tendon reflexes are symmetric. Onto the downgoing. Gait was not tested.  Discharge Diet   Diet regular with thin liquids  DISCHARGE PLAN  Disposition:  Signed out AMA  aspirin 325 mg orally every day for secondary stroke prevention.  Follow-up No PCP Per Patient in 2 weeks.   15 minutes were spent preparing discharge.  Delton Seeavid Rinehuls PA-C [facility] Pager [phone] 07/23/2014, 6:04 PM I have personally examined this patient, reviewed notes, independently viewed imaging studies, participated in medical decision making and plan of care. I have made any additions or clarifications directly to the above note. Agree with note above. Patient was counseled to quit drugs and steroids and smoking. He he was asked to see her ophthalmologist to evaluate his right eye vision loss as an outpatient.  Delia HeadyPramod Sumer [place], MD Medical Director [facility] Pager: [phone] 07/24/2014 2:44 PM

## 2014-07-24 ENCOUNTER — Encounter (HOSPITAL_COMMUNITY): Payer: Self-pay | Admitting: Emergency Medicine

## 2014-07-24 NOTE — Progress Notes (Signed)
UR COMPLETED/ RETRO 

## 2014-12-02 ENCOUNTER — Emergency Department (HOSPITAL_COMMUNITY): Payer: Self-pay

## 2014-12-02 ENCOUNTER — Observation Stay (HOSPITAL_COMMUNITY)
Admission: EM | Admit: 2014-12-02 | Discharge: 2014-12-04 | Disposition: A | Discharge status: Home / Self Care | Payer: Self-pay | Attending: Internal Medicine | Admitting: Internal Medicine

## 2014-12-02 ENCOUNTER — Encounter (HOSPITAL_COMMUNITY): Payer: Self-pay

## 2014-12-02 DIAGNOSIS — G8190 Hemiplegia, unspecified affecting unspecified side: Secondary | ICD-10-CM

## 2014-12-02 DIAGNOSIS — Z8673 Personal history of transient ischemic attack (TIA), and cerebral infarction without residual deficits: Secondary | ICD-10-CM | POA: Insufficient documentation

## 2014-12-02 DIAGNOSIS — R531 Weakness: Principal | ICD-10-CM | POA: Insufficient documentation

## 2014-12-02 DIAGNOSIS — M549 Dorsalgia, unspecified: Secondary | ICD-10-CM | POA: Diagnosis present

## 2014-12-02 DIAGNOSIS — F1910 Other psychoactive substance abuse, uncomplicated: Secondary | ICD-10-CM | POA: Insufficient documentation

## 2014-12-02 DIAGNOSIS — F1010 Alcohol abuse, uncomplicated: Secondary | ICD-10-CM | POA: Insufficient documentation

## 2014-12-02 DIAGNOSIS — Z7982 Long term (current) use of aspirin: Secondary | ICD-10-CM | POA: Insufficient documentation

## 2014-12-02 DIAGNOSIS — F17210 Nicotine dependence, cigarettes, uncomplicated: Secondary | ICD-10-CM | POA: Insufficient documentation

## 2014-12-02 DIAGNOSIS — F329 Major depressive disorder, single episode, unspecified: Secondary | ICD-10-CM | POA: Insufficient documentation

## 2014-12-02 LAB — CBC
HCT: 43.9 % (ref 39.0–52.0)
Hemoglobin: 15 g/dL (ref 13.0–17.0)
MCH: 30.6 pg (ref 26.0–34.0)
MCHC: 34.2 g/dL (ref 30.0–36.0)
MCV: 89.6 fL (ref 78.0–100.0)
PLATELETS: 285 10*3/uL (ref 150–400)
RBC: 4.9 MIL/uL (ref 4.22–5.81)
RDW: 14.2 % (ref 11.5–15.5)
WBC: 7.3 10*3/uL (ref 4.0–10.5)

## 2014-12-02 LAB — DIFFERENTIAL
BASOS ABS: 0.1 10*3/uL (ref 0.0–0.1)
Basophils Relative: 1 % (ref 0–1)
EOS PCT: 1 % (ref 0–5)
Eosinophils Absolute: 0.1 10*3/uL (ref 0.0–0.7)
Lymphocytes Relative: 30 % (ref 12–46)
Lymphs Abs: 2.2 10*3/uL (ref 0.7–4.0)
MONO ABS: 0.8 10*3/uL (ref 0.1–1.0)
Monocytes Relative: 11 % (ref 3–12)
Neutro Abs: 4.2 10*3/uL (ref 1.7–7.7)
Neutrophils Relative %: 57 % (ref 43–77)

## 2014-12-02 LAB — I-STAT TROPONIN, ED: TROPONIN I, POC: 0 ng/mL (ref 0.00–0.08)

## 2014-12-02 LAB — COMPREHENSIVE METABOLIC PANEL
ALT: 17 U/L (ref 0–53)
AST: 33 U/L (ref 0–37)
Albumin: 4.1 g/dL (ref 3.5–5.2)
Alkaline Phosphatase: 52 U/L (ref 39–117)
Anion gap: 11 (ref 5–15)
BUN: 10 mg/dL (ref 6–23)
CHLORIDE: 105 mmol/L (ref 96–112)
CO2: 21 mmol/L (ref 19–32)
CREATININE: 1.25 mg/dL (ref 0.50–1.35)
Calcium: 8.8 mg/dL (ref 8.4–10.5)
GFR, EST AFRICAN AMERICAN: 87 mL/min — AB (ref >90)
GFR, EST NON AFRICAN AMERICAN: 75 mL/min — AB (ref >90)
Glucose, Bld: 113 mg/dL — ABNORMAL HIGH (ref 70–99)
Potassium: 4.7 mmol/L (ref 3.5–5.1)
Sodium: 137 mmol/L (ref 135–145)
Total Bilirubin: 0.9 mg/dL (ref 0.3–1.2)
Total Protein: 7.2 g/dL (ref 6.0–8.3)

## 2014-12-02 LAB — PROTIME-INR
INR: 1.03 (ref 0.00–1.49)
PROTHROMBIN TIME: 13.6 s (ref 11.6–15.2)

## 2014-12-02 LAB — I-STAT CHEM 8, ED
BUN: 12 mg/dL (ref 6–23)
CHLORIDE: 107 mmol/L (ref 96–112)
Calcium, Ion: 1.01 mmol/L — ABNORMAL LOW (ref 1.12–1.23)
Creatinine, Ser: 1.4 mg/dL — ABNORMAL HIGH (ref 0.50–1.35)
GLUCOSE: 110 mg/dL — AB (ref 70–99)
HEMATOCRIT: 47 % (ref 39.0–52.0)
Hemoglobin: 16 g/dL (ref 13.0–17.0)
Potassium: 4.7 mmol/L (ref 3.5–5.1)
Sodium: 138 mmol/L (ref 135–145)
TCO2: 19 mmol/L (ref 0–100)

## 2014-12-02 LAB — RAPID URINE DRUG SCREEN, HOSP PERFORMED
AMPHETAMINES: NOT DETECTED
Barbiturates: NOT DETECTED
Benzodiazepines: POSITIVE — AB
Cocaine: POSITIVE — AB
OPIATES: NOT DETECTED
TETRAHYDROCANNABINOL: NOT DETECTED

## 2014-12-02 LAB — CBG MONITORING, ED: Glucose-Capillary: 113 mg/dL — ABNORMAL HIGH (ref 70–99)

## 2014-12-02 LAB — APTT: aPTT: 30 seconds (ref 24–37)

## 2014-12-02 MED ORDER — SODIUM CHLORIDE 0.9 % IV BOLUS (SEPSIS)
500.0000 mL | Freq: Once | INTRAVENOUS | Status: AC
  Administered 2014-12-02: 500 mL via INTRAVENOUS

## 2014-12-02 MED ORDER — OXYCODONE-ACETAMINOPHEN 5-325 MG PO TABS
1.0000 | ORAL_TABLET | Freq: Once | ORAL | Status: AC
  Administered 2014-12-02: 1 via ORAL
  Filled 2014-12-02: qty 1

## 2014-12-02 NOTE — ED Notes (Signed)
In MRI at this time

## 2014-12-02 NOTE — Consult Note (Signed)
Referring Physician: Estell HarpinZammit    Chief Complaint: Right sided weakness  HPI: Jacob [name] is an 29 y.o. male with a history of polysubstance abuse who reports awakening today with right sided weakness.  Patient reports that he went to bed normal.  When he awakened he felt numb and weak on the right.  Was able to walk around and take care of himself but as the day progressed he became weaker and weaker.  Now he reports that he is unable to use the right.  He presented with similar symptoms in December of 2015.  At that time his MRI of the brain was normal and he signed out AMA.  Echocardiogram and carotid dopplers were normal as well.    Date last known well: Date: 12/02/2014 Time last known well: Time: 03:00 tPA Given: No: Outside time window  Past Medical History  Diagnosis Date  . Stroke     Past Surgical History  Procedure Laterality Date  . Wisdom tooth extraction      Family history: Parents without any significant medical problems.  Has an uncle that has had a stroke.  No family history of diabetes or hypertension.    Social History:  reports that he has been smoking Cigarettes.  He has a 7.5 pack-year smoking history. He does not have any smokeless tobacco history on file. He reports that he drinks alcohol. He reports that he uses illicit drugs (Cocaine).  Allergies: No Known Allergies  Medications: I have reviewed the patient's current medications. Prior to Admission:  None  ROS: History obtained from the patient  General ROS: negative for - chills, fatigue, fever, night sweats, weight gain or weight loss Psychological ROS: negative for - behavioral disorder, hallucinations, memory difficulties, mood swings or suicidal ideation Ophthalmic ROS: negative for - blurry vision, double vision, eye pain or loss of vision ENT ROS: negative for - epistaxis, nasal discharge, oral lesions, sore throat, tinnitus or vertigo Allergy and Immunology ROS: negative for - hives or  itchy/watery eyes Hematological and Lymphatic ROS: negative for - bleeding problems, bruising or swollen lymph nodes Endocrine ROS: negative for - galactorrhea, hair pattern changes, polydipsia/polyuria or temperature intolerance Respiratory ROS: negative for - cough, hemoptysis, shortness of breath or wheezing Cardiovascular ROS: negative for - chest pain, dyspnea on exertion, edema or irregular heartbeat Gastrointestinal ROS: negative for - abdominal pain, diarrhea, hematemesis, nausea/vomiting or stool incontinence Genito-Urinary ROS: negative for - dysuria, hematuria, incontinence or urinary frequency/urgency Musculoskeletal ROS: back pain Neurological ROS: as noted in HPI Dermatological ROS: negative for rash and skin lesion changes  Physical Examination: Blood pressure 131/114, pulse 94, temperature 98.4 F (36.9 C), temperature source Oral, resp. rate 26, height 5\' 11"  (1.803 m), weight 87.998 kg (194 lb), SpO2 97 %.  HEENT-  Normocephalic, no lesions, without obvious abnormality.  Normal external eye and conjunctiva.  Normal TM's bilaterally.  Normal auditory canals and external ears. Normal external nose, mucus membranes and septum.  Normal pharynx. Cardiovascular- S1, S2 normal, pulses palpable throughout   Lungs- chest clear, no wheezing, rales, normal symmetric air entry Abdomen- soft, non-tender; bowel sounds normal; no masses,  no organomegaly Extremities- no edema Lymph-no adenopathy palpable Musculoskeletal-no joint tenderness, deformity or swelling Skin-warm and dry, no hyperpigmentation, vitiligo, or suspicious lesions. Multiple tattoos  Neurological Examination Mental Status: Alert, oriented, thought content appropriate.  Speech fluent without evidence of aphasia.  Able to follow 3 step commands without difficulty. Cranial Nerves: II: Discs flat bilaterally; Visual fields grossly normal, pupils equal,  round, reactive to light and accommodation III,IV, VI: ptosis not  present, extra-ocular motions intact bilaterally V,VII: right facial droop present on volitional testing but not present with speech.  Facial light touch sensation decreased on the right splitting the midline VIII: hearing normal bilaterally IX,X: gag reflex present XI: bilateral shoulder shrug XII: tongue extension to the left Motor: Right : Upper extremity   2/5    Left:     Upper extremity   5/5  Lower extremity   2/5     Lower extremity   5/5 Patient does not have reciprocal downward movement of the LLE when the right is attempted to be moved.  When RUE lifted actively patient does not allow it to hit his face as the extremity falls to the bed.   Tone and bulk:normal tone throughout; no atrophy noted Sensory: Pinprick and light touch decreased on the right upper and lower extremities, splitting the midline on the trunk Deep Tendon Reflexes: 2+ and symmetric throughout Plantars: Right: downgoing   Left: downgoing Cerebellar: normal finger-to-nose and normal heel-to-shin testing on the left.  Unable to perform on the right secondary to weakness Gait: not tested due to safety concerns   Laboratory Studies:  Basic Metabolic Panel:  Recent Labs Lab 12/02/14 1615 12/02/14 1632  NA 137 138  K 4.7 4.7  CL 105 107  CO2 21  --   GLUCOSE 113* 110*  BUN 10 12  CREATININE 1.25 1.40*  CALCIUM 8.8  --     Liver Function Tests:  Recent Labs Lab 12/02/14 1615  AST 33  ALT 17  ALKPHOS 52  BILITOT 0.9  PROT 7.2  ALBUMIN 4.1   No results for input(s): LIPASE, AMYLASE in the last 168 hours. No results for input(s): AMMONIA in the last 168 hours.  CBC:  Recent Labs Lab 12/02/14 1615 12/02/14 1632  WBC 7.3  --   NEUTROABS 4.2  --   HGB 15.0 16.0  HCT 43.9 47.0  MCV 89.6  --   PLT 285  --     Cardiac Enzymes: No results for input(s): CKTOTAL, CKMB, CKMBINDEX, TROPONINI in the last 168 hours.  BNP: Invalid input(s): POCBNP  CBG:  Recent Labs Lab 12/02/14 1619   GLUCAP 113*    Microbiology: No results found for this or any previous visit.  Coagulation Studies:  Recent Labs  12/02/14 1715  LABPROT 13.6  INR 1.03    Urinalysis: No results for input(s): COLORURINE, LABSPEC, PHURINE, GLUCOSEU, HGBUR, BILIRUBINUR, KETONESUR, PROTEINUR, UROBILINOGEN, NITRITE, LEUKOCYTESUR in the last 168 hours.  Invalid input(s): APPERANCEUR  Lipid Panel:    Component Value Date/Time   CHOL 174 07/23/2014 0550   TRIG 150* 07/23/2014 0550   HDL 55 07/23/2014 0550   CHOLHDL 3.2 07/23/2014 0550   VLDL 30 07/23/2014 0550   LDLCALC 89 07/23/2014 0550    HgbA1C:  Lab Results  Component Value Date   HGBA1C 5.4 07/23/2014    Urine Drug Screen:     Component Value Date/Time   LABOPIA NONE DETECTED 12/02/2014 1645   COCAINSCRNUR POSITIVE* 12/02/2014 1645   LABBENZ POSITIVE* 12/02/2014 1645   AMPHETMU NONE DETECTED 12/02/2014 1645   THCU NONE DETECTED 12/02/2014 1645   LABBARB NONE DETECTED 12/02/2014 1645    Alcohol Level: No results for input(s): ETH in the last 168 hours.  Other results: EKG: sinus tachycardia at 1114 bpm with premature atrial complexes.  Imaging: Ct Head (brain) Wo Contrast  12/02/2014   CLINICAL DATA:  Right-sided headache  which began earlier today. Prior history of stroke.  EXAM: CT HEAD WITHOUT CONTRAST  TECHNIQUE: Contiguous axial images were obtained from the base of the skull through the vertex without intravenous contrast.  COMPARISON:  04/16/2014.  FINDINGS: Ventricular system normal in size appearance, unchanged. No mass lesion. No midline shift. No acute hemorrhage or hematoma. No extra-axial fluid collections. No evidence of acute infarction. I do not see evidence of a prior stroke. No significant interval change.  No skull fracture or other focal osseous abnormality involving the skull. Visualized paranasal sinuses, bilateral mastoid air cells and bilateral middle ear cavities well-aerated.  IMPRESSION: Normal  examination. No evidence of a prior stroke as given in the clinical history.   Electronically Signed   By: Hulan Saas M.D.   On: 12/02/2014 17:38    Assessment: 29 y.o. male presenting with complaints of right sided weakness and numbness.  LKW at [facility].  Patient outside window for tPA.  Neurological examination with multiple functional features and previous hospitalization with similar complaints have not led to an abnormal MRI.  Do not expect an acute ischemic event.  Head CT personally reviewed and shows no acute changes.  Patient not taking the recommended ASA from his previous hospitalization.  Work up at that time including an echocardiogram and carotid doppler were unremarkable.    Stroke Risk Factors - smoking and drug abuse  Plan: 1. MRI, MRA  of the brain without contrast 2. Prophylactic therapy-None 3. NPO until RN stroke swallow screen 4. Telemetry monitoring 5. Frequent neuro checks 6. If MRI of the brain shows no evidence of an acute ischemic event no further work up, neuro checks or telemetry monitoring indicated.  Patient will need to be ambulated at that time to determine safety.    Case discussed with Dr. Lunette Stands, MD [facility] [phone] 12/02/2014, 8:46 PM   Addendum: MRI of the brain personally reviewed and is normal  Thana Farr, MD [facility] [phone]

## 2014-12-02 NOTE — ED Notes (Addendum)
Pt here for facial droop and right sided weakness. sts he has had a stroke before that was worse. Went to bed this morning at 0300 and woke up at 0630 with right sided facial droop and right sided weakness. GF with him noticed something wasn't right this morning. Pt noticed his speech was slurred. C/o pain his right back also.

## 2014-12-02 NOTE — ED Provider Notes (Signed)
CSN: [id number]     Arrival date & time 12/02/14  1547 History   First MD Initiated Contact with Patient 12/02/14 1608     Chief Complaint  Patient presents with  . Facial Droop  . Extremity Weakness     (Consider location/radiation/quality/duration/timing/severity/associated sxs/prior Treatment) Patient is a 29 y.o. male presenting with extremity weakness. The history is provided by the patient (the pt states he awoke at 6am with severe weakness to his right arm and right leg with facial weakness.  pt has been using cocaine resently.  he had similar symtoms before).  Extremity Weakness This is a recurrent problem. The current episode started 12 to 24 hours ago. The problem occurs constantly. The problem has not changed since onset.Pertinent negatives include no chest pain, no abdominal pain and no headaches. Nothing aggravates the symptoms. Nothing relieves the symptoms.    Past Medical History  Diagnosis Date  . Stroke    Past Surgical History  Procedure Laterality Date  . Wisdom tooth extraction     No family history on file. History  Substance Use Topics  . Smoking status: Current Every Day Smoker -- 0.50 packs/day for 15 years    Types: Cigarettes  . Smokeless tobacco: Not on file  . Alcohol Use: 0.0 oz/week     Comment: 2 pints a week    Review of Systems  Constitutional: Negative for appetite change and fatigue.  HENT: Negative for congestion, ear discharge and sinus pressure.        Facial weakness  Eyes: Negative for discharge.  Respiratory: Negative for cough.   Cardiovascular: Negative for chest pain.  Gastrointestinal: Negative for abdominal pain and diarrhea.  Genitourinary: Negative for frequency and hematuria.  Musculoskeletal: Positive for extremity weakness. Negative for back pain.       Weakness right arm and leg  Skin: Negative for rash.  Neurological: Negative for seizures and headaches.  Psychiatric/Behavioral: Negative for hallucinations.       Allergies  Review of patient's allergies indicates no known allergies.  Home Medications   Prior to Admission medications   Medication Sig Start Date End Date Taking? Authorizing Provider  ibuprofen (ADVIL,MOTRIN) 200 MG tablet Take 200 mg by mouth every 6 (six) hours as needed.   Yes Historical Provider, MD  doxycycline (VIBRAMYCIN) 100 MG capsule Take 1 capsule (100 mg total) by mouth 2 (two) times daily. One po bid x 10 days Patient not taking: Reported on 12/02/2014 12/25/13   Wayland Salinas, MD  EPINEPHrine (EPIPEN) 0.3 mg/0.3 mL SOAJ injection Inject 0.3 mLs (0.3 mg total) into the muscle as needed. Patient not taking: Reported on 12/02/2014 06/07/13   Jerelyn Scott, MD  ibuprofen (ADVIL,MOTRIN) 800 MG tablet Take 1 tablet (800 mg total) by mouth every 8 (eight) hours as needed. Patient not taking: Reported on 12/02/2014 08/04/13   Layla Maw Ward, DO  oxyCODONE-acetaminophen (PERCOCET/ROXICET) 5-325 MG per tablet Take 1-2 tablets by mouth every 4 (four) hours as needed for moderate pain or severe pain. Patient not taking: Reported on 12/02/2014 04/01/14   Pricilla Loveless, MD  penicillin v potassium (VEETID) 500 MG tablet Take 1 tablet (500 mg total) by mouth 3 (three) times daily. Patient not taking: Reported on 12/02/2014 06/26/13   Elpidio Anis, PA-C  penicillin v potassium (VEETID) 500 MG tablet Take 1 tablet (500 mg total) by mouth 4 (four) times daily. Patient not taking: Reported on 12/02/2014 08/04/13   Layla Maw Ward, DO  traMADol (ULTRAM) 50 MG tablet Take 1  tablet (50 mg total) by mouth every 6 (six) hours as needed. Patient not taking: Reported on 12/02/2014 06/26/13   Elpidio Anis, PA-C   BP 118/73 mmHg  Pulse 87  Temp(Src) 98.4 F (36.9 C) (Oral)  Resp 22  Ht  (1.803 m)  Wt 194 lb (87.998 kg)  BMI 27.07 kg/m2  SpO2 99% Physical Exam  Constitutional: He is oriented to person, place, and time. He appears well-developed.  HENT:  Head: Normocephalic.  Pt has  right side facial weakness  Eyes: Conjunctivae and EOM are normal. No scleral icterus.  Neck: Neck supple. No thyromegaly present.  Cardiovascular: Normal rate and regular rhythm.  Exam reveals no gallop and no friction rub.   No murmur heard. Pulmonary/Chest: No stridor. He has no wheezes. He has no rales. He exhibits no tenderness.  Abdominal: He exhibits no distension. There is no tenderness. There is no rebound.  Musculoskeletal: He exhibits no edema.  Pt has moderate weakness in right arm and leg  Lymphadenopathy:    He has no cervical adenopathy.  Neurological: He is oriented to person, place, and time. He exhibits normal muscle tone. Coordination normal.  Skin: No rash noted. No erythema.  Psychiatric: He has a normal mood and affect. His behavior is normal.    ED Course  Procedures (including critical care time) Labs Review Labs Reviewed  COMPREHENSIVE METABOLIC PANEL - Abnormal; Notable for the following:    Glucose, Bld 113 (*)    GFR calc non Af Amer 75 (*)    GFR calc Af Amer 87 (*)    All other components within normal limits  URINE RAPID DRUG SCREEN (HOSP PERFORMED) - Abnormal; Notable for the following:    Cocaine POSITIVE (*)    Benzodiazepines POSITIVE (*)    All other components within normal limits  CBG MONITORING, ED - Abnormal; Notable for the following:    Glucose-Capillary 113 (*)    All other components within normal limits  I-STAT CHEM 8, ED - Abnormal; Notable for the following:    Creatinine, Ser 1.40 (*)    Glucose, Bld 110 (*)    Calcium, Ion 1.01 (*)    All other components within normal limits  CBC  DIFFERENTIAL  APTT  PROTIME-INR  I-STAT TROPOININ, ED    Imaging Review Ct Head (brain) Wo Contrast  12/02/2014   CLINICAL DATA:  Right-sided headache which began earlier today. Prior history of stroke.  EXAM: CT HEAD WITHOUT CONTRAST  TECHNIQUE: Contiguous axial images were obtained from the base of the skull through the vertex without  intravenous contrast.  COMPARISON:  04/16/2014.  FINDINGS: Ventricular system normal in size appearance, unchanged. No mass lesion. No midline shift. No acute hemorrhage or hematoma. No extra-axial fluid collections. No evidence of acute infarction. I do not see evidence of a prior stroke. No significant interval change.  No skull fracture or other focal osseous abnormality involving the skull. Visualized paranasal sinuses, bilateral mastoid air cells and bilateral middle ear cavities well-aerated.  IMPRESSION: Normal examination. No evidence of a prior stroke as given in the clinical history.   Electronically Signed   By: Hulan Saas M.D.   On: 12/02/2014 17:38   Mr Brain Wo Contrast  12/02/2014   CLINICAL DATA:  Initial evaluation for acute facial droop and right-sided weakness.  EXAM: MRI HEAD WITHOUT CONTRAST  TECHNIQUE: Multiplanar, multiecho pulse sequences of the brain and surrounding structures were obtained without intravenous contrast.  COMPARISON:  Prior CT from earlier the  same day.  FINDINGS: The CSF containing spaces are within normal limits for patient age. No focal parenchymal signal abnormality is identified. No mass lesion, midline shift, or extra-axial fluid collection. Ventricles are normal in size without evidence of hydrocephalus.  No diffusion-weighted signal abnormality is identified to suggest acute intracranial infarct. Gray-white matter differentiation is maintained. Normal flow voids are seen within the intracranial vasculature. No intracranial hemorrhage identified.  The cervicomedullary junction is normal. Pituitary gland is within normal limits. Pituitary stalk is midline. The globes and optic nerves demonstrate a normal appearance with normal signal intensity. The  The bone marrow signal intensity is normal. Calvarium is intact. Visualized upper cervical spine is within normal limits.  Scalp soft tissues are unremarkable.  Paranasal sinuses are clear.  No mastoid effusion.   IMPRESSION: Normal brain MRI with no acute intracranial infarct or other abnormality identified.   Electronically Signed   By: Rise MuBenjamin  McClintock M.D.   On: 12/02/2014 22:51     EKG Interpretation   Date/Time:  Saturday December 02 2014 15:53:27 EDT Ventricular Rate:  114 PR Interval:  142 QRS Duration: 82 QT Interval:  338 QTC Calculation: 465 R Axis:   66 Text Interpretation:  Sinus tachycardia with Premature atrial complexes  with Abberant conduction Otherwise normal ECG No significant change since  last tracing other than rate is faster Confirmed by WARD,  DO, KRISTEN  ([id number]) on 12/02/2014 4:01:43 PM      MDM   Final diagnoses:  Right sided weakness    Mri neg.  Neurology rec. Admission for weakness, but pt did not have a stroke and does not need stroke work up    Bethann BerkshireJoseph Lawernce Earll, MD 12/02/14 2322

## 2014-12-03 ENCOUNTER — Encounter (HOSPITAL_COMMUNITY): Payer: Self-pay | Admitting: *Deleted

## 2014-12-03 ENCOUNTER — Observation Stay (HOSPITAL_COMMUNITY)

## 2014-12-03 ENCOUNTER — Observation Stay (HOSPITAL_COMMUNITY): Payer: Self-pay

## 2014-12-03 DIAGNOSIS — F1910 Other psychoactive substance abuse, uncomplicated: Secondary | ICD-10-CM

## 2014-12-03 DIAGNOSIS — M6289 Other specified disorders of muscle: Secondary | ICD-10-CM

## 2014-12-03 DIAGNOSIS — F1010 Alcohol abuse, uncomplicated: Secondary | ICD-10-CM

## 2014-12-03 DIAGNOSIS — I639 Cerebral infarction, unspecified: Secondary | ICD-10-CM

## 2014-12-03 DIAGNOSIS — M549 Dorsalgia, unspecified: Secondary | ICD-10-CM | POA: Diagnosis present

## 2014-12-03 DIAGNOSIS — R531 Weakness: Secondary | ICD-10-CM

## 2014-12-03 DIAGNOSIS — M545 Low back pain: Secondary | ICD-10-CM

## 2014-12-03 LAB — BASIC METABOLIC PANEL
ANION GAP: 9 (ref 5–15)
BUN: 13 mg/dL (ref 6–23)
CO2: 26 mmol/L (ref 19–32)
Calcium: 8.9 mg/dL (ref 8.4–10.5)
Chloride: 104 mmol/L (ref 96–112)
Creatinine, Ser: 1.15 mg/dL (ref 0.50–1.35)
GFR calc non Af Amer: 83 mL/min — ABNORMAL LOW (ref >90)
Glucose, Bld: 99 mg/dL (ref 70–99)
Potassium: 3.9 mmol/L (ref 3.5–5.1)
SODIUM: 139 mmol/L (ref 135–145)

## 2014-12-03 LAB — LIPID PANEL
CHOLESTEROL: 199 mg/dL (ref 0–200)
HDL: 68 mg/dL (ref >39)
LDL Cholesterol: 96 mg/dL (ref 0–99)
Total CHOL/HDL Ratio: 2.9 RATIO
Triglycerides: 177 mg/dL — ABNORMAL HIGH (ref <150)
VLDL: 35 mg/dL (ref 0–40)

## 2014-12-03 MED ORDER — HYDROMORPHONE HCL 1 MG/ML IJ SOLN
1.0000 mg | INTRAMUSCULAR | Status: DC | PRN
  Administered 2014-12-03 (×4): 2 mg via INTRAVENOUS
  Administered 2014-12-04: 1 mg via INTRAVENOUS
  Administered 2014-12-04 (×4): 2 mg via INTRAVENOUS
  Filled 2014-12-03 (×9): qty 2

## 2014-12-03 MED ORDER — ADULT MULTIVITAMIN W/MINERALS CH
1.0000 | ORAL_TABLET | Freq: Every day | ORAL | Status: DC
  Administered 2014-12-03 – 2014-12-04 (×2): 1 via ORAL
  Filled 2014-12-03 (×2): qty 1

## 2014-12-03 MED ORDER — ONDANSETRON HCL 4 MG/2ML IJ SOLN: 4.0000 mg | Freq: Four times a day (QID) | INTRAMUSCULAR | Status: DC | PRN

## 2014-12-03 MED ORDER — SODIUM CHLORIDE 0.9 % IV SOLN
INTRAVENOUS | Status: DC
  Administered 2014-12-03 – 2014-12-04 (×2): via INTRAVENOUS

## 2014-12-03 MED ORDER — THIAMINE HCL 100 MG/ML IJ SOLN
100.0000 mg | Freq: Every day | INTRAMUSCULAR | Status: DC
  Filled 2014-12-03 (×2): qty 1

## 2014-12-03 MED ORDER — ACETAMINOPHEN 325 MG PO TABS: 650.0000 mg | ORAL_TABLET | Freq: Four times a day (QID) | ORAL | Status: DC | PRN

## 2014-12-03 MED ORDER — ONDANSETRON HCL 4 MG PO TABS: 4.0000 mg | ORAL_TABLET | Freq: Four times a day (QID) | ORAL | Status: DC | PRN

## 2014-12-03 MED ORDER — LORAZEPAM 2 MG/ML IJ SOLN
1.0000 mg | Freq: Four times a day (QID) | INTRAMUSCULAR | Status: DC | PRN
  Administered 2014-12-04: 1 mg via INTRAVENOUS
  Filled 2014-12-03: qty 1

## 2014-12-03 MED ORDER — VITAMIN B-1 100 MG PO TABS
100.0000 mg | ORAL_TABLET | Freq: Every day | ORAL | Status: DC
  Administered 2014-12-03 – 2014-12-04 (×2): 100 mg via ORAL
  Filled 2014-12-03 (×2): qty 1

## 2014-12-03 MED ORDER — ASPIRIN EC 81 MG PO TBEC
81.0000 mg | DELAYED_RELEASE_TABLET | Freq: Every day | ORAL | Status: DC
  Administered 2014-12-03 – 2014-12-04 (×2): 81 mg via ORAL
  Filled 2014-12-03 (×2): qty 1

## 2014-12-03 MED ORDER — FOLIC ACID 1 MG PO TABS
1.0000 mg | ORAL_TABLET | Freq: Every day | ORAL | Status: DC
  Administered 2014-12-03: 1 mg via ORAL
  Filled 2014-12-03 (×3): qty 1

## 2014-12-03 MED ORDER — NICOTINE 14 MG/24HR TD PT24
14.0000 mg | MEDICATED_PATCH | Freq: Every day | TRANSDERMAL | Status: DC
  Administered 2014-12-04: 14 mg via TRANSDERMAL
  Filled 2014-12-03 (×2): qty 1

## 2014-12-03 MED ORDER — PNEUMOCOCCAL VAC POLYVALENT 25 MCG/0.5ML IJ INJ
0.5000 mL | INJECTION | INTRAMUSCULAR | Status: AC
  Administered 2014-12-04: 0.5 mL via INTRAMUSCULAR
  Filled 2014-12-03: qty 0.5

## 2014-12-03 MED ORDER — LORAZEPAM 1 MG PO TABS
1.0000 mg | ORAL_TABLET | Freq: Four times a day (QID) | ORAL | Status: DC | PRN
  Administered 2014-12-03: 1 mg via ORAL
  Filled 2014-12-03: qty 1

## 2014-12-03 MED ORDER — OXYCODONE HCL 5 MG PO TABS
5.0000 mg | ORAL_TABLET | ORAL | Status: DC | PRN
  Administered 2014-12-03 (×2): 10 mg via ORAL
  Administered 2014-12-03: 5 mg via ORAL
  Administered 2014-12-03 – 2014-12-04 (×5): 10 mg via ORAL
  Filled 2014-12-03 (×8): qty 2
  Filled 2014-12-03: qty 1

## 2014-12-03 MED ORDER — OXYCODONE HCL 5 MG PO TABS
5.0000 mg | ORAL_TABLET | ORAL | Status: DC | PRN
  Administered 2014-12-03: 5 mg via ORAL
  Filled 2014-12-03: qty 1

## 2014-12-03 MED ORDER — ACETAMINOPHEN 650 MG RE SUPP: 650.0000 mg | Freq: Four times a day (QID) | RECTAL | Status: DC | PRN

## 2014-12-03 NOTE — Evaluation (Signed)
Physical Therapy Evaluation Patient Details Name: Don BroachMcKlane A Marik MRN: [id number] DOB: 10/06/1985 Today's Date: 12/03/2014   History of Present Illness  Patient is a 29 yo male admitted 12/02/14 with Rt-sided weakness, and back pain.  PMH:  Polysubstance abuse, similar event December 2015   Clinical Impression  Patient presents with problems listed below.  Will benefit from acute PT to maximize functional independence prior to discharge.  Patient was independent pta.  Recommend Inpatient Rehab consult to facilitate return to highest functional level.    Follow Up Recommendations CIR;Supervision/Assistance - 24 hour    Equipment Recommendations  3in1 (PT);Other (comment) (Hemi-walker - will continue to assess)    Recommendations for Other Services Rehab consult     Precautions / Restrictions Precautions Precautions: Fall Restrictions Weight Bearing Restrictions: No      Mobility  Bed Mobility Overal bed mobility: Needs Assistance Bed Mobility: Supine to Sit;Sit to Supine     Supine to sit: Min guard Sit to supine: Min assist   General bed mobility comments: Verbal cues for technique.  Patient able to use LUE to move RUE/RLE to transition to sitting EOB.  Assist for safety/balance.  Patient sat EOB x 5 minutes, reporting dizziness and back pain.  Returned to supine with assist to bring RLE onto bed.  Transfers                 General transfer comment: Declined due to dizziness and back pain.  Reports he is going for MRI soon.  Ambulation/Gait             General Gait Details: Patient reports he has been going to bathroom using RW.  States "it's no good".  Explained that this is not safe.  Will try hemiwalker at next session.  Stairs            Wheelchair Mobility    Modified Rankin (Stroke Patients Only) Modified Rankin (Stroke Patients Only) Pre-Morbid Rankin Score: No symptoms Modified Rankin: Moderately severe disability     Balance Overall  balance assessment: Needs assistance Sitting-balance support: No upper extremity supported;Feet supported Sitting balance-Leahy Scale: Fair                                       Pertinent Vitals/Pain Pain Assessment: 0-10 Pain Score: 7  Pain Location: back Pain Descriptors / Indicators: Aching;Sore Pain Intervention(s): Monitored during session;Repositioned    Home Living Family/patient expects to be discharged to:: Private residence Living Arrangements: Spouse/significant other Available Help at Discharge: Family;Available 24 hours/day Type of Home: House Home Access: Stairs to enter Entrance Stairs-Rails: Doctor, general practiceight;Left Entrance Stairs-Number of Steps: 3 Home Layout: One level Home Equipment: None      Prior Function Level of Independence: Independent         Comments: Driving     Hand Dominance   Dominant Hand: Right    Extremity/Trunk Assessment   Upper Extremity Assessment: RUE deficits/detail RUE Deficits / Details: Strength grossly 1/5 (trace movements)         Lower Extremity Assessment: RLE deficits/detail RLE Deficits / Details: Strength grossly 1/5 - 2-/5       Communication   Communication: Expressive difficulties (Patient reports his speech is "not right")  Cognition Arousal/Alertness: Awake/alert Behavior During Therapy: Flat affect Overall Cognitive Status: Within Functional Limits for tasks assessed (Decreased safety awareness)  General Comments      Exercises        Assessment/Plan    PT Assessment Patient needs continued PT services  PT Diagnosis Difficulty walking;Acute pain;Hemiplegia dominant side   PT Problem List Decreased strength;Decreased activity tolerance;Decreased balance;Decreased mobility;Decreased knowledge of use of DME;Decreased safety awareness;Pain  PT Treatment Interventions DME instruction;Gait training;Functional mobility training;Therapeutic activities;Balance  training;Neuromuscular re-education;Patient/family education   PT Goals (Current goals can be found in the Care Plan section) Acute Rehab PT Goals Patient Stated Goal: To stop back pain PT Goal Formulation: With patient/family Time For Goal Achievement: 12/17/14 Potential to Achieve Goals: Good    Frequency Min 4X/week   Barriers to discharge        Co-evaluation               End of Session   Activity Tolerance: Patient limited by pain Patient left: in bed;with call bell/phone within reach;with family/visitor present;with nursing/sitter in room Nurse Communication: Mobility status    Functional Assessment Tool Used: Clinical judgement Functional Limitation: Mobility: Walking and moving around Mobility: Walking and Moving Around Current Status (V9563): At least 60 percent but less than 80 percent impaired, limited or restricted Mobility: Walking and Moving Around Goal Status [phone]): At least 20 percent but less than 40 percent impaired, limited or restricted    Time: 3329-5188 PT Time Calculation (min) (ACUTE ONLY): 16 min   Charges:   PT Evaluation $Initial PT Evaluation Tier I: 1 Procedure     PT G Codes:   PT G-Codes **NOT FOR INPATIENT CLASS** Functional Assessment Tool Used: Clinical judgement Functional Limitation: Mobility: Walking and moving around Mobility: Walking and Moving Around Current Status (C1660): At least 60 percent but less than 80 percent impaired, limited or restricted Mobility: Walking and Moving Around Goal Status [phone]): At least 20 percent but less than 40 percent impaired, limited or restricted    Vena Austria 12/03/2014, 7:33 PM Durenda Hurt. Renaldo Fiddler, [facility] Acute Rehab Services Pager [phone]

## 2014-12-03 NOTE — H&P (Signed)
[facility] History and Physical  Don BroachMcKlane A Hannum ZOX:[id number] DOB: 08/23/1985 DOA: 12/02/2014   Chief Complaint: Right sided weakness  HPI: Kadan A Rogene Houstonennell is a 29 y.o. gentleman with no significant past medical history who presented to the ED for evaluation of right sided weakness.  The patient reports significant EtOH use as well as cocaine and benzodiazepine (purchased off the street) use last night.  He says that he went to bed without any symptoms, but he noticed right sided weakness as well as facial droop this morning upon awakening.  After several hours at home, his girlfriend was able to help him get to the car, and she brought him to the ED for further evaluation.  The patient had a similar presentation in December 2015.  Acute stroke was not identified at that time (MRI/MRA unremarkable, mild carotid disease on dopplers, echo did not show acute findings).  Symptoms apparently resolved spontaneously and the patient left the hospital AMA.  The patient has been seen and evaluated the neurology.  MRI does not show acute CVA.  No further work-up for stroke is recommended at this time.  The patient is being admitted for observation and PT evaluation (he still has right sided weakness and cannot walk).  Currently complaining of back pain.  Review of Systems: 10 systems reviewed and negative except as stated in HPI.  Past Medical History  Diagnosis Date  . Stroke    Past Surgical History  Procedure Laterality Date  . Wisdom tooth extraction     Social History:  Has abused EtOH and illegal drugs for the past two years, triggered by the death of his newborn son.  Active tobacco use.  He reports EtOH binges where he can drink a case of beer at a time.  He reports prior symptoms of EtOH withdrawal but denies history of seizure.  No Known Allergies  Family History: Maternal grandfather had heart disease.  Prior to Admission medications   NONE  Physical Exam: Filed Vitals:    12/02/14 2245 12/02/14 2330 12/03/14 0000 12/03/14 0042  BP: 118/73  132/96 134/74  Pulse: 87 77 75 63  Temp:    98.6 F (37 C)  TempSrc:    Oral  Resp:   16 19  Height:      Weight:      SpO2: 99% 98% 97% 98%     General:  Awake and alert.  Oriented to person, place, time and situation.  NAD.  Speech is slurred.  Eyes: PERRL bilaterally, conjunctiva are pink.  EOMI.  ENT: Moist mucous membranes.  No nasal drainage.  Neck: Supple.  No carotid bruit.   Cardiovascular: NR/RR.  No LE edema.  Respiratory: CTA bilaterally.  Abdomen: Soft/NT/ND.  Bowel sounds are present.  No guarding.  Skin: Warm and dry.  Musculoskeletal: Moves all four extremities spontaneously though he has weakness and reduced range of motion in both upper and lower extremity on the right.  Psychiatric: Normal affect.  Neurologic: + facial droop otherwise cranial nerves grossly intact.  + right sided weakness.   Labs on Admission:  Basic Metabolic Panel:  Recent Labs Lab 12/02/14 1615 12/02/14 1632  NA 137 138  K 4.7 4.7  CL 105 107  CO2 21  --   GLUCOSE 113* 110*  BUN 10 12  CREATININE 1.25 1.40*  CALCIUM 8.8  --    Liver Function Tests:  Recent Labs Lab 12/02/14 1615  AST 33  ALT 17  ALKPHOS 52  BILITOT 0.9  PROT 7.2  ALBUMIN 4.1   CBC:  Recent Labs Lab 12/02/14 1615 12/02/14 1632  WBC 7.3  --   NEUTROABS 4.2  --   HGB 15.0 16.0  HCT 43.9 47.0  MCV 89.6  --   PLT 285  --    CBG:  Recent Labs Lab 12/02/14 1619  GLUCAP 113*    Radiological Exams on Admission: Ct Head (brain) Wo Contrast  12/02/2014   CLINICAL DATA:  Right-sided headache which began earlier today. Prior history of stroke.  EXAM: CT HEAD WITHOUT CONTRAST  TECHNIQUE: Contiguous axial images were obtained from the base of the skull through the vertex without intravenous contrast.  COMPARISON:  04/16/2014.  FINDINGS: Ventricular system normal in size appearance, unchanged. No mass lesion. No midline  shift. No acute hemorrhage or hematoma. No extra-axial fluid collections. No evidence of acute infarction. I do not see evidence of a prior stroke. No significant interval change.  No skull fracture or other focal osseous abnormality involving the skull. Visualized paranasal sinuses, bilateral mastoid air cells and bilateral middle ear cavities well-aerated.  IMPRESSION: Normal examination. No evidence of a prior stroke as given in the clinical history.   Electronically Signed   By: Hulan Saas M.D.   On: 12/02/2014 17:38   Mr Brain Wo Contrast  12/02/2014   CLINICAL DATA:  Initial evaluation for acute facial droop and right-sided weakness.  EXAM: MRI HEAD WITHOUT CONTRAST  TECHNIQUE: Multiplanar, multiecho pulse sequences of the brain and surrounding structures were obtained without intravenous contrast.  COMPARISON:  Prior CT from earlier the same day.  FINDINGS: The CSF containing spaces are within normal limits for patient age. No focal parenchymal signal abnormality is identified. No mass lesion, midline shift, or extra-axial fluid collection. Ventricles are normal in size without evidence of hydrocephalus.  No diffusion-weighted signal abnormality is identified to suggest acute intracranial infarct. Gray-white matter differentiation is maintained. Normal flow voids are seen within the intracranial vasculature. No intracranial hemorrhage identified.  The cervicomedullary junction is normal. Pituitary gland is within normal limits. Pituitary stalk is midline. The globes and optic nerves demonstrate a normal appearance with normal signal intensity. The  The bone marrow signal intensity is normal. Calvarium is intact. Visualized upper cervical spine is within normal limits.  Scalp soft tissues are unremarkable.  Paranasal sinuses are clear.  No mastoid effusion.  IMPRESSION: Normal brain MRI with no acute intracranial infarct or other abnormality identified.   Electronically Signed   By: Rise Mu M.D.   On: 12/02/2014 22:51    EKG: Independently reviewed. Sinus tachycardia.  No acute ST segment changes.  Assessment/Plan Active Problems:   Stroke-like episode   Polysubstance abuse   Right sided weakness   1. Admit for observation, telemetry. 2. Neurology consult appreciated.  Baby aspirin now.  Neurochecks.  PT eval and treat.  No clear what is driving symptoms at this point (? Sequela of vasoconstrictive prodrome triggered by cocaine vs. Malingering vs. Psych). 3.  Patient counseled on the need to abstain from polysubstance abuse.  Low threshold for initiating CIWA protocol if exhibits signs/symptoms of withdrawal. 4.  Fasting lipid panel and A1c ordered. 5.  Anticipate discharge when he is back to baseline.   Code Status: FULL  Time spent: 60 minutes  [employer] [facility]  12/03/2014, 12:48 AM

## 2014-12-03 NOTE — Progress Notes (Signed)
Subjective: No overnight events. Main concern is severe lumbar back pain.   MRI brain is unremarkable (imaging reviewed).   Objective: Current vital signs: BP 128/86 mmHg  Pulse 63  Temp(Src) 98.4 F (36.9 C) (Oral)  Resp 18  Ht  (1.803 m)  Wt 87.2 kg (192 lb 3.9 oz)  BMI 26.82 kg/m2  SpO2 100% Vital signs in last 24 hours: Temp:  [98.4 F (36.9 C)-98.6 F (37 C)] 98.4 F (36.9 C) (04/24 0524) Pulse Rate:  [63-110] 63 (04/24 0524) Resp:  [16-26] 18 (04/24 0524) BP: (102-155)/(52-114) 128/86 mmHg (04/24 0524) SpO2:  [93 %-100 %] 100 % (04/24 0524) Weight:  [87.2 kg (192 lb 3.9 oz)-87.998 kg (194 lb)] 87.2 kg (192 lb 3.9 oz) (04/24 0042)  Intake/Output from previous day: 04/23 0701 - 04/24 0700 In: -  Out: 200 [Urine:200] Intake/Output this shift:   Nutritional status: Diet regular Room service appropriate?: Yes; Fluid consistency:: Thin  Neurologic Exam: Mental Status: Alert, oriented, thought content appropriate. Speech fluent without evidence of aphasia.  Cranial Nerves: II: pupils equal, round, reactive to light  III,IV, VI: ptosis not present, extra-ocular motions intact bilaterally V,VII: right facial droop present on volitional testing but not present with speech. Facial light touch sensation decreased on the right splitting the midline Motor: Right :Upper extremity 2/5Left: Upper extremity 5/5 Lower extremity 2/5Lower extremity 5/5 Patient does not have reciprocal downward movement of the LLE when the right is attempted to be moved. When RUE lifted actively patient does not allow it to hit his face as the extremity falls to the bed.  Tone and bulk:normal tone throughout; no atrophy noted Sensory: Pinprick and light touch decreased on the right upper and lower extremities, splitting the midline on the trunk  Lab Results: Basic Metabolic  Panel:  Recent Labs Lab 12/02/14 1615 12/02/14 1632  NA 137 138  K 4.7 4.7  CL 105 107  CO2 21  --   GLUCOSE 113* 110*  BUN 10 12  CREATININE 1.25 1.40*  CALCIUM 8.8  --     Liver Function Tests:  Recent Labs Lab 12/02/14 1615  AST 33  ALT 17  ALKPHOS 52  BILITOT 0.9  PROT 7.2  ALBUMIN 4.1   No results for input(s): LIPASE, AMYLASE in the last 168 hours. No results for input(s): AMMONIA in the last 168 hours.  CBC:  Recent Labs Lab 12/02/14 1615 12/02/14 1632  WBC 7.3  --   NEUTROABS 4.2  --   HGB 15.0 16.0  HCT 43.9 47.0  MCV 89.6  --   PLT 285  --     Cardiac Enzymes: No results for input(s): CKTOTAL, CKMB, CKMBINDEX, TROPONINI in the last 168 hours.  Lipid Panel: No results for input(s): CHOL, TRIG, HDL, CHOLHDL, VLDL, LDLCALC in the last 168 hours.  CBG:  Recent Labs Lab 12/02/14 1619  GLUCAP 113*    Microbiology: No results found for this or any previous visit.  Coagulation Studies:  Recent Labs  12/02/14 1715  LABPROT 13.6  INR 1.03    Imaging: Ct Head (brain) Wo Contrast  12/02/2014   CLINICAL DATA:  Right-sided headache which began earlier today. Prior history of stroke.  EXAM: CT HEAD WITHOUT CONTRAST  TECHNIQUE: Contiguous axial images were obtained from the base of the skull through the vertex without intravenous contrast.  COMPARISON:  04/16/2014.  FINDINGS: Ventricular system normal in size appearance, unchanged. No mass lesion. No midline shift. No acute hemorrhage or hematoma. No extra-axial fluid  collections. No evidence of acute infarction. I do not see evidence of a prior stroke. No significant interval change.  No skull fracture or other focal osseous abnormality involving the skull. Visualized paranasal sinuses, bilateral mastoid air cells and bilateral middle ear cavities well-aerated.  IMPRESSION: Normal examination. No evidence of a prior stroke as given in the clinical history.   Electronically Signed   By: Hulan Saashomas   Lawrence M.D.   On: 12/02/2014 17:38   Mr Brain Wo Contrast  12/02/2014   CLINICAL DATA:  Initial evaluation for acute facial droop and right-sided weakness.  EXAM: MRI HEAD WITHOUT CONTRAST  TECHNIQUE: Multiplanar, multiecho pulse sequences of the brain and surrounding structures were obtained without intravenous contrast.  COMPARISON:  Prior CT from earlier the same day.  FINDINGS: The CSF containing spaces are within normal limits for patient age. No focal parenchymal signal abnormality is identified. No mass lesion, midline shift, or extra-axial fluid collection. Ventricles are normal in size without evidence of hydrocephalus.  No diffusion-weighted signal abnormality is identified to suggest acute intracranial infarct. Gray-white matter differentiation is maintained. Normal flow voids are seen within the intracranial vasculature. No intracranial hemorrhage identified.  The cervicomedullary junction is normal. Pituitary gland is within normal limits. Pituitary stalk is midline. The globes and optic nerves demonstrate a normal appearance with normal signal intensity. The  The bone marrow signal intensity is normal. Calvarium is intact. Visualized upper cervical spine is within normal limits.  Scalp soft tissues are unremarkable.  Paranasal sinuses are clear.  No mastoid effusion.  IMPRESSION: Normal brain MRI with no acute intracranial infarct or other abnormality identified.   Electronically Signed   By: Rise MuBenjamin  McClintock M.D.   On: 12/02/2014 22:51    Medications:  Scheduled: . aspirin EC  81 mg Oral Daily  . folic acid  1 mg Oral Daily  . multivitamin with minerals  1 tablet Oral Daily  . [START ON 12/04/2014] pneumococcal 23 valent vaccine  0.5 mL Intramuscular Tomorrow-1000  . thiamine  100 mg Oral Daily   Or  . thiamine  100 mg Intravenous Daily    Assessment/Plan:  29 y.o. male presenting with complaints of right sided weakness and numbness. MRI brain negative for acute stroke.  Neurological examination with multiple functional features and previous hospitalization with similar complaints have had unremarkable workup. Suspect functional etiology of his current symptoms.  -no further inpatient neurological workup indicated -PT/OT evaluation -may benefit from psychiatry or substance abuse rehab referral -continue ASA 81mg  daily   LOS: 1 day   Elspeth Choeter Jin Shockley, DO [facility] [phone]  If 7pm- 7am, please page neurology on call as listed in AMION. 12/03/2014  8:17 AM

## 2014-12-03 NOTE — ED Notes (Signed)
Admitting MD at bedside.

## 2014-12-03 NOTE — Progress Notes (Signed)
[facility]                                                                              Patient Demographics  Jacob [name], is a 29 y.o. male, DOB - 07/23/1986, ZOX:[id number]  Admit date - 12/02/2014   Admitting Physician Jacob Litter, MD  Outpatient Primary MD for the patient is No PCP Per Patient  LOS - 1   Chief Complaint  Patient presents with  . Facial Droop  . Extremity Weakness       Brief HPI   Patient is a 29 y.o. male with no significant past medical history presented to ED with evaluation of right sided weakness.Patient reported significant alcohol use, cocaine and benzodiazepine (purchased off the street) a night before the admission. Patient noted that he went to the bed without any symptoms however he woke up and noticed right sided weakness as well as facial droop. After several hours at home, his girlfriend was able to help him get to the car, and she brought him to the ED for further evaluation. The patient had a similar presentation in December 2015. Acute stroke was not identified at that time (MRI/MRA unremarkable, mild carotid disease on dopplers, echo did not show acute findings). Symptoms apparently resolved spontaneously and the patient left the hospital AMA. Patient was seen by neurology in the ED, MRI did not show acute CVA. Patient was admitted for further observation and PT evaluation, (he still has right sided weakness and cannot walk). 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 That is why he bought  all that cocaine and benzodiazepine and took it with alcohol and on the pain - During the examination, patient was focused on IV pain medications -  will obtain MRI of the thoracic and lumbar spine, he has tenderness in the area - For now continue pain control    Polysubstance abuse - UDS positive for cocaine and benzodiazepines - Counseled strongly on quitting the drugs    Alcohol abuse - placed on CIWA protocol with Ativan - Continue thiamine, folate, MVI   Code Status: fc  Family Communication: Discussed in detail with the patient, all imaging results, lab results explained to the patient   Disposition Plan: Await MRI, PT evaluation  Time Spent in minutes  25 minutes  Procedures  MRI BRAIN  Consults   NEURO  DVT Prophylaxis  SCD's  Medications  Scheduled Meds: . aspirin EC  81 mg Oral Daily  . folic acid  1 mg Oral Daily  . multivitamin with minerals  1 tablet Oral Daily  . [START ON 12/04/2014] pneumococcal 23 valent vaccine  0.5 mL Intramuscular Tomorrow-1000  . thiamine  100 mg Oral Daily   Or  .  thiamine  100 mg Intravenous Daily   Continuous Infusions: . sodium chloride 100 mL/hr at 12/03/14 0748   PRN Meds:.acetaminophen **OR** acetaminophen, HYDROmorphone (DILAUDID) injection, LORazepam **OR** LORazepam, ondansetron **OR** ondansetron (ZOFRAN) IV, oxyCODONE   Antibiotics   Anti-infectives    None        Subjective:   Jacob [name] was seen and examined today.   Patient denies dizziness, chest pain, shortness of breath, abdominal pain, N/V/D/C.Marland Kitchen No acute events overnight.  Patient asking for IV pain medications, specifically named dilaudid. During the whole examination, patient was focused on IV pain medications. Complaining of back pain, severe, no radiation to lower extremities, no urinary retention or incontinence  Objective:   Blood pressure 128/86, pulse 63, temperature 98.4 F (36.9 C), temperature source Oral, resp. rate 18, height   (1.803 m), weight 87.2 kg (192 lb 3.9 oz), SpO2 100 %.  Wt Readings from Last 3 Encounters:  12/03/14 87.2 kg (192 lb 3.9 oz)  12/03/14 87.091 kg (192 lb)  12/03/14 87.091 kg (192 lb)     Intake/Output Summary (Last 24 hours) at 12/03/14 1215 Last data filed at 12/03/14 1026  Gross per 24 hour  Intake    240 ml  Output    600 ml  Net   -360 ml    Exam  General: Alert and oriented x 3, NAD  HEENT:  PERRLA, EOMI, Anicteic Sclera, mucous membranes moist.   Neck: Supple, no JVD, no masses  CVS: S1 S2 auscultated, no rubs, murmurs or gallops. Regular rate and rhythm.  Respiratory: Clear to auscultation bilaterally, no wheezing, rales or rhonchi  Abdomen: Soft, nontender, nondistended, + bowel sounds  Ext: no cyanosis clubbing or edema  Neuro: AAOx3, Cr N's II- XII. Strength 5/5 upper and lower extremitieson the left, patient made no effort on the right   Skin: No rashes  Psych: Normal affect and demeanor, alert and oriented x3   Back: spinal tenderness in lumbar area   Data Review   Micro Results No results found for this or any previous visit (from the past 240 hour(s)).  Radiology Reports Ct Head (brain) Wo Contrast  12/02/2014   CLINICAL DATA:  Right-sided headache which began earlier today. Prior history of stroke.  EXAM: CT HEAD WITHOUT CONTRAST  TECHNIQUE: Contiguous axial images were obtained from the base of the skull through the vertex without intravenous contrast.  COMPARISON:  04/16/2014.  FINDINGS: Ventricular system normal in size appearance, unchanged. No mass lesion. No midline shift. No acute hemorrhage or hematoma. No extra-axial fluid collections. No evidence of acute infarction. I do not see evidence of a prior stroke. No significant interval change.  No skull fracture or other focal osseous abnormality involving the skull. Visualized paranasal sinuses, bilateral mastoid air cells and bilateral middle ear cavities well-aerated.  IMPRESSION: Normal  examination. No evidence of a prior stroke as given in the clinical history.   Electronically Signed   By: Hulan Saas M.D.   On: 12/02/2014 17:38   Mr Brain Wo Contrast  12/02/2014   CLINICAL DATA:  Initial evaluation for acute facial droop and right-sided weakness.  EXAM: MRI HEAD WITHOUT CONTRAST  TECHNIQUE: Multiplanar, multiecho pulse sequences of the brain and surrounding structures were obtained without intravenous contrast.  COMPARISON:  Prior CT from earlier the same day.  FINDINGS: The CSF containing spaces are within normal limits for patient age. No focal parenchymal signal abnormality is identified. No mass lesion, midline shift, or extra-axial fluid collection. Ventricles are normal  in size without evidence of hydrocephalus.  No diffusion-weighted signal abnormality is identified to suggest acute intracranial infarct. Gray-white matter differentiation is maintained. Normal flow voids are seen within the intracranial vasculature. No intracranial hemorrhage identified.  The cervicomedullary junction is normal. Pituitary gland is within normal limits. Pituitary stalk is midline. The globes and optic nerves demonstrate a normal appearance with normal signal intensity. The  The bone marrow signal intensity is normal. Calvarium is intact. Visualized upper cervical spine is within normal limits.  Scalp soft tissues are unremarkable.  Paranasal sinuses are clear.  No mastoid effusion.  IMPRESSION: Normal brain MRI with no acute intracranial infarct or other abnormality identified.   Electronically Signed   By: Rise Mu M.D.   On: 12/02/2014 22:51   Mr Thoracic Spine Wo Contrast  12/03/2014   CLINICAL DATA:  Right-sided weakness. Alcohol and drug use. Normal MRI of the brain 12/02/2014.  EXAM: MRI THORACIC SPINE WITHOUT CONTRAST  TECHNIQUE: Multiplanar, multisequence MR imaging of the thoracic spine was performed. No intravenous contrast was administered.  COMPARISON:  MRI brain  12/02/2014  FINDINGS: Normal signal is present in the thoracic spinal cord to the T12-L1 level. Marrow signal, vertebral body heights, alignment are normal. Disc signal and heights are normal. There is no significant disc protrusion or stenosis. The foramina are patent bilaterally.  IMPRESSION: Negative MRI of the thoracic spine.   Electronically Signed   By: Marin Roberts M.D.   On: 12/03/2014 12:07    CBC  Recent Labs Lab 12/02/14 1615 12/02/14 1632  WBC 7.3  --   HGB 15.0 16.0  HCT 43.9 47.0  PLT 285  --   MCV 89.6  --   MCH 30.6  --   MCHC 34.2  --   RDW 14.2  --   LYMPHSABS 2.2  --   MONOABS 0.8  --   EOSABS 0.1  --   BASOSABS 0.1  --     Chemistries   Recent Labs Lab 12/02/14 1615 12/02/14 1632 12/03/14 0545  NA 137 138 139  K 4.7 4.7 3.9  CL 105 107 104  CO2 21  --  26  GLUCOSE 113* 110* 99  BUN CREATININE 1.25 1.40* 1.15  CALCIUM 8.8  --  8.9  AST 33  --   --   ALT 17  --   --   ALKPHOS 52  --   --   BILITOT 0.9  --   --    ------------------------------------------------------------------------------------------------------------------ estimated creatinine clearance is 99.1 mL/min (by C-G formula based on Cr of 1.15). ------------------------------------------------------------------------------------------------------------------ No results for input(s): HGBA1C in the last 72 hours. ------------------------------------------------------------------------------------------------------------------  Recent Labs  12/03/14 0545  CHOL 199  HDL 68  LDLCALC 96  TRIG 177*  CHOLHDL 2.9   ------------------------------------------------------------------------------------------------------------------ No results for input(s): TSH, T4TOTAL, T3FREE, THYROIDAB in the last 72 hours.  Invalid input(s): FREET3 ------------------------------------------------------------------------------------------------------------------ No results for  input(s): VITAMINB12, FOLATE, FERRITIN, TIBC, IRON, RETICCTPCT in the last 72 hours.  Coagulation profile  Recent Labs Lab 12/02/14 1715  INR 1.03    No results for input(s): DDIMER in the last 72 hours.  Cardiac Enzymes No results for input(s): CKMB, TROPONINI, MYOGLOBIN in the last 168 hours.  Invalid input(s): CK ------------------------------------------------------------------------------------------------------------------ Invalid input(s): POCBNP   Recent Labs  12/02/14 1619  GLUCAP 113*     Braya Habermehl M.D. [facility] 12/03/2014, 12:15 PM  Pager: [phone]   Between 7am to 7pm - call Pager - [phone]  After  7pm go to [email or web address] - password [email or web address]  Call night coverage person covering after 7pm

## 2014-12-03 NOTE — Progress Notes (Signed)
UR completed 

## 2014-12-03 NOTE — Progress Notes (Signed)
NURSING PROGRESS NOTE  Don BroachMcKlane A Macioce [id number] Admission Data: 12/03/2014 1:00 AM Attending Provider: Michael LitterNikki Carter, MD PCP:No PCP Per Patient Code Status: Full  Isreal A Rogene Houstonennell is a 29 y.o. male patient admitted from ED:  -No acute distress noted.  -No complaints of shortness of breath.  -No complaints of chest pain.   Cardiac Monitoring: N/A  Blood pressure 134/74, pulse 63, temperature 98.6 F (37 C), temperature source Oral, resp. rate 19, height 5\' 11"  (1.803 m), weight 87.2 kg (192 lb 3.9 oz), SpO2 98 %.   IV Fluids:  IV in place, occlusive dsg intact without redness, IV cath antecubital right, no redness none.   Allergies:  Review of patient's allergies indicates no known allergies.  Past Medical History:   has a past medical history of Stroke.  Past Surgical History:   has past surgical history that includes Wisdom tooth extraction.  Social History:   reports that he has been smoking Cigarettes.  He has a 7.5 pack-year smoking history. He does not have any smokeless tobacco history on file. He reports that he drinks alcohol. He reports that he uses illicit drugs (Cocaine).  Skin: Intact, multiple tattoos on face, upper body, and upper extremities.  Patient/Family orientated to room. Information packet given to patient/family. Admission inpatient armband information verified with patient/family to include name and date of birth and placed on patient arm. Side rails up x 2, fall assessment and education completed with patient/family. Patient/family able to verbalize understanding of risk associated with falls and verbalized understanding to call for assistance before getting out of bed. Call light within reach. Patient/family able to voice and demonstrate understanding of unit orientation instructions.

## 2014-12-04 DIAGNOSIS — F329 Major depressive disorder, single episode, unspecified: Secondary | ICD-10-CM

## 2014-12-04 DIAGNOSIS — R45851 Suicidal ideations: Secondary | ICD-10-CM

## 2014-12-04 LAB — BASIC METABOLIC PANEL
Anion gap: 8 (ref 5–15)
BUN: 9 mg/dL (ref 6–23)
CALCIUM: 8.9 mg/dL (ref 8.4–10.5)
CO2: 28 mmol/L (ref 19–32)
Chloride: 99 mmol/L (ref 96–112)
Creatinine, Ser: 1.19 mg/dL (ref 0.50–1.35)
GFR calc non Af Amer: 80 mL/min — ABNORMAL LOW (ref >90)
GLUCOSE: 89 mg/dL (ref 70–99)
Potassium: 4.3 mmol/L (ref 3.5–5.1)
SODIUM: 135 mmol/L (ref 135–145)

## 2014-12-04 LAB — CBC
HEMATOCRIT: 41 % (ref 39.0–52.0)
Hemoglobin: 13.6 g/dL (ref 13.0–17.0)
MCH: 30.4 pg (ref 26.0–34.0)
MCHC: 33.2 g/dL (ref 30.0–36.0)
MCV: 91.7 fL (ref 78.0–100.0)
Platelets: 250 10*3/uL (ref 150–400)
RBC: 4.47 MIL/uL (ref 4.22–5.81)
RDW: 13.8 % (ref 11.5–15.5)
WBC: 6.9 10*3/uL (ref 4.0–10.5)

## 2014-12-04 LAB — HEMOGLOBIN A1C
Hgb A1c MFr Bld: 5.4 % (ref 4.8–5.6)
MEAN PLASMA GLUCOSE: 108 mg/dL

## 2014-12-04 MED ORDER — FLUOXETINE HCL 20 MG PO CAPS: 20.0000 mg | ORAL_CAPSULE | Freq: Every day | ORAL | Status: DC

## 2014-12-04 MED ORDER — ASPIRIN 81 MG PO TBEC
81.0000 mg | DELAYED_RELEASE_TABLET | Freq: Every day | ORAL | Status: AC
Start: 2014-12-04 — End: ?

## 2014-12-04 MED ORDER — NICOTINE 14 MG/24HR TD PT24: 14.0000 mg | MEDICATED_PATCH | Freq: Every day | TRANSDERMAL | Status: DC

## 2014-12-04 MED ORDER — GABAPENTIN 600 MG PO TABS
300.0000 mg | ORAL_TABLET | Freq: Two times a day (BID) | ORAL | Status: DC
  Filled 2014-12-04 (×2): qty 0.5

## 2014-12-04 MED ORDER — ASPIRIN 81 MG PO TBEC: 81.0000 mg | DELAYED_RELEASE_TABLET | Freq: Every day | ORAL | Status: DC

## 2014-12-04 MED ORDER — FLUOXETINE HCL 20 MG PO CAPS
20.0000 mg | ORAL_CAPSULE | Freq: Every day | ORAL | Status: DC
  Filled 2014-12-04: qty 1

## 2014-12-04 MED ORDER — GABAPENTIN 600 MG PO TABS: 300.0000 mg | ORAL_TABLET | Freq: Two times a day (BID) | ORAL | Status: AC

## 2014-12-04 MED ORDER — TRAMADOL HCL 50 MG PO TABS: 50.0000 mg | ORAL_TABLET | Freq: Four times a day (QID) | ORAL | Status: AC | PRN

## 2014-12-04 MED ORDER — OXYCODONE-ACETAMINOPHEN 5-325 MG PO TABS: 1.0000 | ORAL_TABLET | Freq: Four times a day (QID) | ORAL | Status: AC | PRN

## 2014-12-04 MED ORDER — TRAMADOL HCL 50 MG PO TABS: 50.0000 mg | ORAL_TABLET | Freq: Four times a day (QID) | ORAL | Status: DC | PRN

## 2014-12-04 MED ORDER — GABAPENTIN 600 MG PO TABS: 300.0000 mg | ORAL_TABLET | Freq: Two times a day (BID) | ORAL | Status: DC

## 2014-12-04 MED ORDER — FLUOXETINE HCL 20 MG PO CAPS: 20.0000 mg | ORAL_CAPSULE | Freq: Every day | ORAL | Status: AC

## 2014-12-04 MED ORDER — OXYCODONE-ACETAMINOPHEN 5-325 MG PO TABS: 1.0000 | ORAL_TABLET | Freq: Four times a day (QID) | ORAL | Status: DC | PRN

## 2014-12-04 MED ORDER — NICOTINE 14 MG/24HR TD PT24: 14.0000 mg | MEDICATED_PATCH | Freq: Every day | TRANSDERMAL | Status: AC

## 2014-12-04 NOTE — Care Management Note (Signed)
    Page 1 of 1   12/04/2014     5:14:40 PM CARE MANAGEMENT NOTE 12/04/2014  Patient:  Jacob [name],Jacob [name]   Account Number:  [id number]  Date Initiated:  12/04/2014  Documentation initiated by:  Letha CapeAYLOR,Irva Loser  Subjective/Objective Assessment:   dx right side weakness  admit- from home.     Action/Plan:   pt eval- rec cane   Anticipated DC Date:  12/04/2014   Anticipated DC Plan:  HOME/SELF CARE      DC Planning Services  CM consult      Choice offered to / List presented to:             Status of service:  Completed, signed off Medicare Important Message given?  NO (If response is "NO", the following Medicare IM given date fields will be blank) Date Medicare IM given:   Medicare IM given by:   Date Additional Medicare IM given:   Additional Medicare IM given by:    Discharge Disposition:  HOME/SELF CARE  Per UR Regulation:  Reviewed for med. necessity/level of care/duration of stay  If discussed at Long Length of Stay Meetings, dates discussed:    Comments:  12/04/14 1702 Letha Capeeborah Brehanna Deveny RN, BSN [phone] patient dc today, NCM received referral for hhpt/ot, patient does not have insurance and can not afford hhpt/ot. [facility] is closed and therefore can not make referral , will try to make referral for patient tomorrow for [name] follow up. Patient wants me to call him with this information , his cell phone [phone].

## 2014-12-04 NOTE — Progress Notes (Signed)
Physical Therapy Treatment Patient Details Name: Jacob [name] MRN: [id number] DOB: 05/14/1986 Today's Date: 12/04/2014    History of Present Illness Patient is a 29 yo male admitted 12/02/14 with Rt-sided weakness, and back pain.  PMH:  Polysubstance abuse, similar event December 2015     PT Comments    Patient progressed to be able to ambulate with quad cane this session.  Still occasionally has difficulty with progressing right LE, but improved during session especially from initial walk leaning on IV pole.  Feel patient may qualify for outpatient PT rehab [facility].  He will need a large based quad cane for home.  Follow Up Recommendations  Other (comment) (if patient gets referred to  [facility] may get financial assist to be able to go to outpatient rehab)     Equipment Recommendations  Gilmer MorCane (large based quad cane)    Recommendations for Other Services       Precautions / Restrictions Precautions Precautions: Fall    Mobility  Bed Mobility               General bed mobility comments: pt sitting edge of bed, dressed with shoes on upon my entry  Transfers Overall transfer level: Modified independent Equipment used: None Transfers: Sit to/from Stand Sit to Stand: Modified independent (Device/Increase time)            Ambulation/Gait Ambulation/Gait assistance: Min guard;Supervision Ambulation Distance (Feet): 200 Feet (and 70') Assistive device: Quad cane (pushing IV pole first, then with QC) Gait Pattern/deviations: Step-to pattern;Shuffle;Trunk flexed;Decreased dorsiflexion - right     General Gait Details: initially walked down hall and back pushing IV pole with unsafe technique dragging right foot and taking long steps with left and leaning heavily on IV pole.  Then demonstrated with QC and pt ambulated short distance with cane still dragging right foot  at time, but demonstrating improved safety with slower speed and decreased stride length with cues   Stairs            Wheelchair Mobility    Modified Rankin (Stroke Patients Only) Modified Rankin (Stroke Patients Only) Pre-Morbid Rankin Score: No symptoms Modified Rankin: Moderately severe disability     Balance Overall balance assessment: Needs assistance   Sitting balance-Leahy Scale: Fair         Standing balance comment: stands with out UE support, but needs assist for ambulation                    Cognition Arousal/Alertness: Awake/alert Behavior During Therapy: WFL for tasks assessed/performed (depressed) Overall Cognitive Status: No family/caregiver present to determine baseline cognitive functioning (decreased safety awareness, impulsive)                      Exercises      General Comments General comments (skin integrity, edema, etc.): Patient requesting to go outside to get fresh air, then to go to car to get a book.  RN informed and reports he cannot go outside due to drug use history      Pertinent Vitals/Pain Pain Assessment: Faces Faces Pain Scale: Hurts even more Pain Location: lower back with ambulation Pain Intervention(s): Monitored during session    Home Living                      Prior Function  PT Goals (current goals can now be found in the care plan section) Progress towards PT goals: Progressing toward goals    Frequency  Min 4X/week    PT Plan Discharge plan needs to be updated    Co-evaluation             End of Session   Activity Tolerance: Patient tolerated treatment well Patient left: in bed;with call bell/phone within reach     Time: 2956-2130 PT Time Calculation (min) (ACUTE ONLY): 24 min  Charges:  $Gait Training: 23-37 mins                    G Codes:      Jacob [name],Jacob [name] 01-01-2015, 4:29 PM  Sheran Lawless, PT [phone] 01-01-2015

## 2014-12-04 NOTE — Progress Notes (Signed)
Physical medicine rehabilitation consult requested chart reviewed. Patient admitted with nonspecific right-sided weakness urine drug screen positive for cocaine and benzodiazepines. Patient with recent admission December 2015 with workup negative symptoms resolved and left AMA. Workup again at this time has been unremarkable. Cannot justify inpatient rehabilitation services at this time. Recommendations are discharged to home versus skilled facility

## 2014-12-04 NOTE — Discharge Summary (Signed)
Physician Discharge Summary   Patient ID: Jacob [name] MRN: [id number] DOB/AGE: 31/02/1986 31 y.o.  Admit date: 12/02/2014 Discharge date: 12/04/2014  Primary Care Physician:  No PCP Per Patient  Discharge Diagnoses:    . Right sided weakness- functional component, no CVA  . Polysubstance abuse . Alcohol abuse . Back pain  Consults: Neurology, Dr Hosie [facility], Dr Sheryle SprayJonalagada   Recommendations for Outpatient Follow-up:  Patient was admitted to follow-up with PCP outpatient    DIET: Regular diet    Allergies:  No Known Allergies   Discharge Medications:   Medication List    STOP taking these medications        doxycycline 100 MG capsule  Commonly known as:  VIBRAMYCIN     penicillin v potassium 500 MG tablet  Commonly known as:  VEETID      TAKE these medications        aspirin 81 MG EC tablet  Take 1 tablet (81 mg total) by mouth daily.     EPINEPHrine 0.3 mg/0.3 mL Soaj injection  Commonly known as:  EPIPEN  Inject 0.3 mLs (0.3 mg total) into the muscle as needed.     FLUoxetine 20 MG capsule  Commonly known as:  PROZAC  Take 1 capsule (20 mg total) by mouth daily.     gabapentin 600 MG tablet  Commonly known as:  NEURONTIN  Take 0.5 tablets (300 mg total) by mouth 2 (two) times daily.     ibuprofen 200 MG tablet  Commonly known as:  ADVIL,MOTRIN  Take 200 mg by mouth every 6 (six) hours as needed.     nicotine 14 mg/24hr patch  Commonly known as:  NICODERM CQ - dosed in mg/24 hours  Place 1 patch (14 mg total) onto the skin daily.     oxyCODONE-acetaminophen 5-325 MG per tablet  Commonly known as:  PERCOCET/ROXICET  Take 1-2 tablets by mouth every 6 (six) hours as needed for severe pain. No refills, Further refills by primary MD     traMADol 50 MG tablet  Commonly known as:  ULTRAM  Take 1 tablet (50 mg total) by mouth every 6 (six) hours as needed for moderate pain.         Brief H and P: For complete details please refer to  admission H and P, but in brief Patient is a 29 y.o. male with no significant past medical history presented to ED with evaluation of right sided weakness.Patient reported significant alcohol use, cocaine and benzodiazepine (purchased off the street) a night before the admission. Patient noted that he went to the bed without any symptoms however he woke up and noticed right sided weakness as well as facial droop. After several hours at home, his girlfriend was able to help him get to the car, and she brought him to the ED for further evaluation. The patient had a similar presentation in December 2015. Acute stroke was not identified at that time (MRI/MRA unremarkable, mild carotid disease on dopplers, echo did not show acute findings). Symptoms apparently resolved spontaneously and the patient left the hospital AMA. Patient was seen by neurology in the ED, MRI did not show acute CVA. Patient was admitted for further observation and PT evaluation, (he still has right sided weakness and cannot walk). He also complained of back pain  Hospital Course:  Right sided weakness: Unclear etiology, improved spontaneously with many functional features during the examination MRI brain negative for acute CVA, neurological examination with multiple functional features and  similar previous hospitalization No further stroke workup recommended by neurology continue aspirin. During examination, patient had many functional features and prior to discharge, patient walked out of his room and was ambulating in the hallway without any assistance and texting on his phone without any difficulty    Acute back pain: Patient reported that he works in [employer] and fell off the concrete truck 2 weeks ago and since then his back is hurting. That is why he bought all that cocaine and benzodiazepine and took it with alcohol and on the pain. MRI of the thoracic and lumbar spine was essentially normal without any fracture or disc  issues. He was declined by inpatient rehabilitation. PT/OT recommended home health physical therapy which was ordered. However subsequently later, patient was noticed to be ambulating in the hallway without any difficulty without any assistance   Polysubstance abuse - UDS positive for cocaine and benzodiazepines - Counseled strongly on quitting the drugs   Alcohol abuse - placed on CIWA protocol with Ativan - Continue thiamine, folate, MVI  Depression - Psychiatry consult was called and patient was seen by Dr. Sheryle Spray, who recommended starting patient on fluoxetine and Neurontin.   Day of Discharge BP 137/102 mmHg  Pulse 84  Temp(Src) 98.2 F (36.8 C) (Oral)  Resp 20  Ht  (1.803 m)  Wt 87.2 kg (192 lb 3.9 oz)  BMI 26.82 kg/m2  SpO2 98%  Physical Exam: General: Alert and awake oriented x3 not in any acute distress. HEENT: anicteric sclera, pupils reactive to light and accommodation CVS: S1-S2 clear no murmur rubs or gallops Chest: clear to auscultation bilaterally, no wheezing rales or rhonchi Abdomen: soft nontender, nondistended, normal bowel sounds Extremities: no cyanosis, clubbing or edema noted bilaterally Neuro: Cranial nerves II-XII intact, no focal neurological deficits   The results of significant diagnostics from this hospitalization (including imaging, microbiology, ancillary and laboratory) are listed below for reference.    LAB RESULTS: Basic Metabolic Panel:  Recent Labs Lab 12/03/14 0545 12/04/14 0705  NA 139 135  K 3.9 4.3  CL 104 99  CO2 26 28  GLUCOSE 99 89  BUN 13 9  CREATININE 1.15 1.19  CALCIUM 8.9 8.9   Liver Function Tests:  Recent Labs Lab 12/02/14 1615  AST 33  ALT 17  ALKPHOS 52  BILITOT 0.9  PROT 7.2  ALBUMIN 4.1   No results for input(s): LIPASE, AMYLASE in the last 168 hours. No results for input(s): AMMONIA in the last 168 hours. CBC:  Recent Labs Lab 12/02/14 1615 12/02/14 1632 12/04/14 0705  WBC 7.3   --  6.9  NEUTROABS 4.2  --   --   HGB 15.0 16.0 13.6  HCT 43.9 47.0 41.0  MCV 89.6  --  91.7  PLT 285  --  250   Cardiac Enzymes: No results for input(s): CKTOTAL, CKMB, CKMBINDEX, TROPONINI in the last 168 hours. BNP: Invalid input(s): POCBNP CBG:  Recent Labs Lab 12/02/14 1619  GLUCAP 113*    Significant Diagnostic Studies:  Ct Head (brain) Wo Contrast  12/02/2014   CLINICAL DATA:  Right-sided headache which began earlier today. Prior history of stroke.  EXAM: CT HEAD WITHOUT CONTRAST  TECHNIQUE: Contiguous axial images were obtained from the base of the skull through the vertex without intravenous contrast.  COMPARISON:  04/16/2014.  FINDINGS: Ventricular system normal in size appearance, unchanged. No mass lesion. No midline shift. No acute hemorrhage or hematoma. No extra-axial fluid collections. No evidence of acute infarction. I do  not see evidence of a prior stroke. No significant interval change.  No skull fracture or other focal osseous abnormality involving the skull. Visualized paranasal sinuses, bilateral mastoid air cells and bilateral middle ear cavities well-aerated.  IMPRESSION: Normal examination. No evidence of a prior stroke as given in the clinical history.   Electronically Signed   By: Hulan Saas M.D.   On: 12/02/2014 17:38   Mr Brain Wo Contrast  12/02/2014   CLINICAL DATA:  Initial evaluation for acute facial droop and right-sided weakness.  EXAM: MRI HEAD WITHOUT CONTRAST  TECHNIQUE: Multiplanar, multiecho pulse sequences of the brain and surrounding structures were obtained without intravenous contrast.  COMPARISON:  Prior CT from earlier the same day.  FINDINGS: The CSF containing spaces are within normal limits for patient age. No focal parenchymal signal abnormality is identified. No mass lesion, midline shift, or extra-axial fluid collection. Ventricles are normal in size without evidence of hydrocephalus.  No diffusion-weighted signal abnormality is  identified to suggest acute intracranial infarct. Gray-white matter differentiation is maintained. Normal flow voids are seen within the intracranial vasculature. No intracranial hemorrhage identified.  The cervicomedullary junction is normal. Pituitary gland is within normal limits. Pituitary stalk is midline. The globes and optic nerves demonstrate a normal appearance with normal signal intensity. The  The bone marrow signal intensity is normal. Calvarium is intact. Visualized upper cervical spine is within normal limits.  Scalp soft tissues are unremarkable.  Paranasal sinuses are clear.  No mastoid effusion.  IMPRESSION: Normal brain MRI with no acute intracranial infarct or other abnormality identified.   Electronically Signed   By: Rise Mu M.D.   On: 12/02/2014 22:51   Mr Thoracic Spine Wo Contrast  12/03/2014   CLINICAL DATA:  Right-sided weakness. Alcohol and drug use. Normal MRI of the brain 12/02/2014.  EXAM: MRI THORACIC SPINE WITHOUT CONTRAST  TECHNIQUE: Multiplanar, multisequence MR imaging of the thoracic spine was performed. No intravenous contrast was administered.  COMPARISON:  MRI brain 12/02/2014  FINDINGS: Normal signal is present in the thoracic spinal cord to the T12-L1 level. Marrow signal, vertebral body heights, alignment are normal. Disc signal and heights are normal. There is no significant disc protrusion or stenosis. The foramina are patent bilaterally.  IMPRESSION: Negative MRI of the thoracic spine.   Electronically Signed   By: Marin Roberts M.D.   On: 12/03/2014 12:07   Mr Lumbar Spine Wo Contrast  12/03/2014   CLINICAL DATA:  Alcohol and drug use. The patient awoke with right-sided weakness and facial droop.  EXAM: MRI LUMBAR SPINE WITHOUT CONTRAST  TECHNIQUE: Multiplanar, multisequence MR imaging of the lumbar spine was performed. No intravenous contrast was administered.  COMPARISON:  Lumbar spine radiographs 04/16/2014.  FINDINGS: Normal signal is  present in the conus medullaris which terminates at L2. Marrow signal, vertebral body heights, alignment are normal in the lumbar spine. A 7 mm sclerotic lesion is present within the right sacral ala. This is incompletely imaged.  Limited imaging the abdomen is unremarkable. There is no significant adenopathy.  No significant disc protrusion or stenosis is present. The disc signal and height is preserved. The foramina are patent bilaterally.  IMPRESSION: 1. Normal MRI appearance of the lumbar spine. 2. Benign appearing 7 mm sclerotic lesion in the right sacral ala.   Electronically Signed   By: Marin Roberts M.D.   On: 12/03/2014 16:03    2D ECHO:   Disposition and Follow-up:     Discharge Instructions    Diet -  low sodium heart healthy    Complete by:  As directed      Increase activity slowly    Complete by:  As directed             DISPOSITION: Home   DISCHARGE FOLLOW-UP Follow-up Information    Schedule an appointment as soon as possible for a visit in 2 weeks to follow up.   Why:  for hospital follow-up   Contact information:   please follow-up with your PCP        Time spent on Discharge: 35 mins  Signed:   RAI,RIPUDEEP M.D. [facility] 12/04/2014, 3:31 PM Pager: [phone]

## 2014-12-04 NOTE — Progress Notes (Signed)
Rehab Admissions Coordinator Note:  Patient was screened by Clois DupesBoyette, Jame Seelig Godwin for appropriateness for an Inpatient Acute Rehab Consult per PT recommendation. Noted Dr. Isidoro Donningai has requested an inpt rehab consult. We will follow up after MD makes his recommendations.  Clois DupesBoyette, Persephanie Laatsch Godwin 12/04/2014, 8:56 AM  I can be reached at [phone].
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
## 2014-12-04 NOTE — Evaluation (Signed)
Occupational Therapy Evaluation Patient Details Name: Jacob [name] MRN: [id number] DOB: 11/11/1985 Today's Date: 12/04/2014    History of Present Illness Patient is a 29 yo male admitted 12/02/14 with Rt-sided weakness, and back pain.  PMH:  Polysubstance abuse, similar event December 2015 . MRI negative.   Clinical Impression   Pt admitted with above. Pt independent with ADLs, PTA. Feel pt will benefit from acute OT to increase independence and address RUE prior to d/c.     Follow Up Recommendations  Home health OT;Supervision/Assistance - 24 hour    Equipment Recommendations  3 in 1 bedside comode;Tub/shower bench    Recommendations for Other Services       Precautions / Restrictions Precautions Precautions: Fall Restrictions Weight Bearing Restrictions: No      Mobility Bed Mobility Overal bed mobility: Needs Assistance Bed Mobility: Supine to Sit;Sit to Supine     Supine to sit: Supervision Sit to supine: Modified independent (Device/Increase time)      Transfers Overall transfer level: Needs assistance   Transfers: Sit to/from Stand;Stand Pivot Transfers Sit to Stand: Min guard Stand pivot transfers: Min guard            Balance    Min guard for stand pivot.                                         ADL Overall ADL's : Needs assistance/impaired                 Upper Body Dressing : Minimal assistance;Sitting   Lower Body Dressing: Minimal assistance;Sitting/lateral leans;Bed level   Toilet Transfer: Min guard;Stand-pivot (bed <> chair)           Functional mobility during ADLs: Min guard (stand pivot) General ADL Comments: Encouraged pt to be using Rt hand. Educated on UB dressing technique. Discussed recommending 3 in 1. Discussed d/c options.     Vision     Perception     Praxis      Pertinent Vitals/Pain Pain Assessment: 0-10 Pain Score: 10-Worst pain ever Pain Location: back Pain Intervention(s):  Monitored during session (notified nurse)     Hand Dominance Left   Extremity/Trunk Assessment Upper Extremity Assessment Upper Extremity Assessment: RUE deficits/detail RUE Deficits / Details: Limited AROM shoulder flexion, however is able to help control descent when arm is descending. RUE Sensation: decreased light touch RUE Coordination: decreased fine motor   Lower Extremity Assessment Lower Extremity Assessment: Defer to PT evaluation       Communication Communication Communication: No difficulties   Cognition Arousal/Alertness: Awake/alert Behavior During Therapy: WFL for tasks assessed/performed Overall Cognitive Status: No family/caregiver present to determine baseline cognitive functioning (decreased safety awareness)                     General Comments       Exercises       Shoulder Instructions      Home Living Family/patient expects to be discharged to:: Private residence Living Arrangements: Spouse/significant other Available Help at Discharge: Family;Available 24 hours/day Type of Home: House Home Access: Stairs to enter Entergy CorporationEntrance Stairs-Number of Steps: 3 Entrance Stairs-Rails: Right;Left Home Layout: One level     Bathroom Shower/Tub: Tub/shower unit;Walk-in shower         Home Equipment:  (chair he can use in shower)          Prior Functioning/Environment  Level of Independence: Independent        Comments: Driving    OT Diagnosis: Generalized weakness;Acute pain   OT Problem List: Decreased strength;Decreased activity tolerance;Impaired balance (sitting and/or standing);Decreased knowledge of use of DME or AE;Decreased knowledge of precautions;Decreased safety awareness;Pain;Impaired UE functional use;Impaired sensation   OT Treatment/Interventions: Self-care/ADL training;Therapeutic exercise;DME and/or AE instruction;Therapeutic activities;Patient/family education;Balance training;Cognitive remediation/compensation    OT  Goals(Current goals can be found in the care plan section) Acute Rehab OT Goals Patient Stated Goal: not stated OT Goal Formulation: With patient Time For Goal Achievement: 12/11/14 Potential to Achieve Goals: Good ADL Goals Pt Will Perform Lower Body Dressing: sit to/from stand;with set-up Pt Will Transfer to Toilet: bedside commode;stand pivot transfer;with supervision;ambulating Pt Will Perform Tub/Shower Transfer: Tub transfer;with supervision;tub bench Additional ADL Goal #1: Pt will independently perform HEP for RUE to increase strength and coordination.   OT Frequency: Min 2X/week   Barriers to D/C:            Co-evaluation              End of Session Equipment Utilized During Treatment: Gait belt Nurse Communication: Other (comment);Mobility status (pt in pain)  Activity Tolerance: Patient tolerated treatment well Patient left: in bed;with call bell/phone within reach;with bed alarm set   Time: 8119-1478 OT Time Calculation (min): 17 min Charges:  OT General Charges $OT Visit: 1 Procedure OT Evaluation $Initial OT Evaluation Tier I: 1 Procedure G-Codes: OT G-codes **NOT FOR INPATIENT CLASS** Functional Assessment Tool Used: clinical judgment Functional Limitation: Self care Self Care Current Status (G9562): At least 1 percent but less than 20 percent impaired, limited or restricted Self Care Goal Status (Z3086): At least 1 percent but less than 20 percent impaired, limited or restricted  Earlie Raveling OTR/L [phone] 12/04/2014, 10:29 AM

## 2014-12-04 NOTE — Progress Notes (Signed)
Patient ambulating with IV pole off the unit to the main floor.  Found patient on the main floor and informed him that he was not allowed off the unit unaccompanied.  Escorted patient back to the unit and then to his room.   Danne HarborGlodean Natajah Derderian, RN 12/04/14 at 1200

## 2014-12-04 NOTE — Progress Notes (Signed)
[facility]                                                                              Patient Demographics  Jacob [name], is a 29 y.o. male, DOB - 08/18/1985, ZOX:[id number]  Admit date - 12/02/2014   Admitting Physician Michael LitterNikki Carter, MD  Outpatient Primary MD for the patient is No PCP Per Patient  LOS - 2   Chief Complaint  Patient presents with  . Facial Droop  . Extremity Weakness       Brief HPI   Patient is a 29 y.o. male with no significant past medical history presented to ED with evaluation of right sided weakness.Patient reported significant alcohol use, cocaine and benzodiazepine (purchased off the street) a night before the admission. Patient noted that he went to the bed without any symptoms however he woke up and noticed right sided weakness as well as facial droop. After several hours at home, his girlfriend was able to help him get to the car, and she brought him to the ED for further evaluation. The patient had a similar presentation in December 2015. Acute stroke was not identified at that time (MRI/MRA unremarkable, mild carotid disease on dopplers, echo did not show acute findings). Symptoms apparently resolved spontaneously and the patient left the hospital AMA. Patient was seen by neurology in the ED, MRI did not show acute CVA. Patient was admitted for further observation and PT evaluation, (he still has right sided weakness and cannot walk). 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 That is why he bought all that cocaine and benzodiazepine and took it with alcohol and on the pain -MRI of the thoracic and lumbar spine was essentially normal without any fracture or disc issues - PTOT, declined by inpatient rehabilitation, home versus skilled nursing facility    Polysubstance abuse - UDS positive for cocaine and benzodiazepines - Counseled strongly on quitting the drugs    Alcohol abuse - placed on CIWA protocol with Ativan - Continue thiamine, folate, MVI  Depression - Patient requested for psychiatry consult, called  Code Status:  Full code  Family Communication: Discussed in detail with the patient, all imaging results, lab results explained to the patient   Disposition Plan: Hopefully tomorrow home versus skilled nursing facility  Time Spent in minutes  25 minutes  Procedures  MRI BRAIN MRI of the thoracic and lumbar spine  Consults   NEURO  DVT Prophylaxis  SCD's  Medications  Scheduled Meds: . aspirin EC  81 mg Oral Daily  . folic acid  1 mg Oral Daily  . multivitamin with minerals  1 tablet Oral Daily  .  nicotine  14 mg Transdermal Daily  . thiamine  100 mg Oral Daily   Or  . thiamine  100 mg Intravenous Daily   Continuous Infusions: . sodium chloride 100 mL/hr at 12/04/14 0136   PRN Meds:.acetaminophen **OR** acetaminophen, HYDROmorphone (DILAUDID) injection, LORazepam **OR** LORazepam, ondansetron **OR** ondansetron (ZOFRAN) IV, oxyCODONE   Antibiotics   Anti-infectives    None        Subjective:   Jacob [name] was seen and examined today.   Patient denies dizziness, chest pain, shortness of breath, abdominal pain, N/V/D/C.Marland Kitchen Today feeling better, was able to lift up his right leg slightly. When I lifted up his right arm, he held up the arm and it did not fall down to the bed.   Objective:   Blood pressure 124/85, pulse 65, temperature 97.7 F (36.5 C),  temperature source Oral, resp. rate 18, height  (1.803 m), weight 87.2 kg (192 lb 3.9 oz), SpO2 97 %.  Wt Readings from Last 3 Encounters:  12/03/14 87.2 kg (192 lb 3.9 oz)  12/03/14 87.091 kg (192 lb)  12/03/14 87.091 kg (192 lb)     Intake/Output Summary (Last 24 hours) at 12/04/14 1228 Last data filed at 12/04/14 0954  Gross per 24 hour  Intake   2880 ml  Output   1250 ml  Net   1630 ml    Exam  General: Alert and oriented x 3, NAD  HEENT:  PERRLA, EOMI, Anicteic Sclera, mucous membranes moist.   Neck: Supple, no JVD, no masses  CVS: S1 S2clear, no MRG  Respiratory:CTA B   Abdomen: Soft, nontender, nondistended, + bowel sounds  Ext: no cyanosis clubbing or edema  Neuro: AAOx3, Cr N's II- XII. Strength 5/5 upper and lower extremitieson the left. RUE 5/5, RLE 3-4/5  Skin: No rashes  Psych: Normal affect and demeanor, alert and oriented x3   Back: spinal tenderness in lumbar area   Data Review   Micro Results No results found for this or any previous visit (from the past 240 hour(s)).  Radiology Reports Ct Head (brain) Wo Contrast  12/02/2014   CLINICAL DATA:  Right-sided headache which began earlier today. Prior history of stroke.  EXAM: CT HEAD WITHOUT CONTRAST  TECHNIQUE: Contiguous axial images were obtained from the base of the skull through the vertex without intravenous contrast.  COMPARISON:  04/16/2014.  FINDINGS: Ventricular system normal in size appearance, unchanged. No mass lesion. No midline shift. No acute hemorrhage or hematoma. No extra-axial fluid collections. No evidence of acute infarction. I do not see evidence of a prior stroke. No significant interval change.  No skull fracture or other focal osseous abnormality involving the skull. Visualized paranasal sinuses, bilateral mastoid air cells and bilateral middle ear cavities well-aerated.  IMPRESSION: Normal examination. No evidence of a prior stroke as given in the clinical history.    Electronically Signed   By: Hulan Saas M.D.   On: 12/02/2014 17:38   Mr Brain Wo Contrast  12/02/2014   CLINICAL DATA:  Initial evaluation for acute facial droop and right-sided weakness.  EXAM: MRI HEAD WITHOUT CONTRAST  TECHNIQUE: Multiplanar, multiecho pulse sequences of the brain and surrounding structures were obtained without intravenous contrast.  COMPARISON:  Prior CT from earlier the same day.  FINDINGS: The CSF containing spaces are within normal limits for patient age. No focal parenchymal signal abnormality is identified. No mass lesion, midline shift, or extra-axial fluid collection. Ventricles are normal in size without evidence of hydrocephalus.  No  diffusion-weighted signal abnormality is identified to suggest acute intracranial infarct. Gray-white matter differentiation is maintained. Normal flow voids are seen within the intracranial vasculature. No intracranial hemorrhage identified.  The cervicomedullary junction is normal. Pituitary gland is within normal limits. Pituitary stalk is midline. The globes and optic nerves demonstrate a normal appearance with normal signal intensity. The  The bone marrow signal intensity is normal. Calvarium is intact. Visualized upper cervical spine is within normal limits.  Scalp soft tissues are unremarkable.  Paranasal sinuses are clear.  No mastoid effusion.  IMPRESSION: Normal brain MRI with no acute intracranial infarct or other abnormality identified.   Electronically Signed   By: Rise Mu M.D.   On: 12/02/2014 22:51   Mr Thoracic Spine Wo Contrast  12/03/2014   CLINICAL DATA:  Right-sided weakness. Alcohol and drug use. Normal MRI of the brain 12/02/2014.  EXAM: MRI THORACIC SPINE WITHOUT CONTRAST  TECHNIQUE: Multiplanar, multisequence MR imaging of the thoracic spine was performed. No intravenous contrast was administered.  COMPARISON:  MRI brain 12/02/2014  FINDINGS: Normal signal is present in the thoracic spinal cord to the T12-L1  level. Marrow signal, vertebral body heights, alignment are normal. Disc signal and heights are normal. There is no significant disc protrusion or stenosis. The foramina are patent bilaterally.  IMPRESSION: Negative MRI of the thoracic spine.   Electronically Signed   By: Marin Roberts M.D.   On: 12/03/2014 12:07   Mr Lumbar Spine Wo Contrast  12/03/2014   CLINICAL DATA:  Alcohol and drug use. The patient awoke with right-sided weakness and facial droop.  EXAM: MRI LUMBAR SPINE WITHOUT CONTRAST  TECHNIQUE: Multiplanar, multisequence MR imaging of the lumbar spine was performed. No intravenous contrast was administered.  COMPARISON:  Lumbar spine radiographs 04/16/2014.  FINDINGS: Normal signal is present in the conus medullaris which terminates at L2. Marrow signal, vertebral body heights, alignment are normal in the lumbar spine. A 7 mm sclerotic lesion is present within the right sacral ala. This is incompletely imaged.  Limited imaging the abdomen is unremarkable. There is no significant adenopathy.  No significant disc protrusion or stenosis is present. The disc signal and height is preserved. The foramina are patent bilaterally.  IMPRESSION: 1. Normal MRI appearance of the lumbar spine. 2. Benign appearing 7 mm sclerotic lesion in the right sacral ala.   Electronically Signed   By: Marin Roberts M.D.   On: 12/03/2014 16:03    CBC  Recent Labs Lab 12/02/14 1615 12/02/14 1632 12/04/14 0705  WBC 7.3  --  6.9  HGB 15.0 16.0 13.6  HCT 43.9 47.0 41.0  PLT 285  --  250  MCV 89.6  --  91.7  MCH 30.6  --  30.4  MCHC 34.2  --  33.2  RDW 14.2  --  13.8  LYMPHSABS 2.2  --   --   MONOABS 0.8  --   --   EOSABS 0.1  --   --   BASOSABS 0.1  --   --     Chemistries   Recent Labs Lab 12/02/14 1615 12/02/14 1632 12/03/14 0545 12/04/14 0705  NA 137 138 139 135  K 4.7 4.7 3.9 4.3  CL 105 107 104 99  CO2 21  --  26 28  GLUCOSE 113* 110* 99 89  BUN 10 12 13 9   CREATININE 1.25  1.40* 1.15 1.19  CALCIUM 8.8  --  8.9 8.9  AST 33  --   --   --  ALT 17  --   --   --   ALKPHOS 52  --   --   --   BILITOT 0.9  --   --   --    ------------------------------------------------------------------------------------------------------------------ estimated creatinine clearance is 95.8 mL/min (by C-G formula based on Cr of 1.19). ------------------------------------------------------------------------------------------------------------------  Recent Labs  12/03/14 0545  HGBA1C 5.4   ------------------------------------------------------------------------------------------------------------------  Recent Labs  12/03/14 0545  CHOL 199  HDL 68  LDLCALC 96  TRIG 177*  CHOLHDL 2.9   ------------------------------------------------------------------------------------------------------------------ No results for input(s): TSH, T4TOTAL, T3FREE, THYROIDAB in the last 72 hours.  Invalid input(s): FREET3 ------------------------------------------------------------------------------------------------------------------ No results for input(s): VITAMINB12, FOLATE, FERRITIN, TIBC, IRON, RETICCTPCT in the last 72 hours.  Coagulation profile  Recent Labs Lab 12/02/14 1715  INR 1.03    No results for input(s): DDIMER in the last 72 hours.  Cardiac Enzymes No results for input(s): CKMB, TROPONINI, MYOGLOBIN in the last 168 hours.  Invalid input(s): CK ------------------------------------------------------------------------------------------------------------------ Invalid input(s): POCBNP   Recent Labs  12/02/14 1619  GLUCAP 113*     RAI,RIPUDEEP M.D. [facility] 12/04/2014, 12:28 PM  Pager: [phone]   Between 7am to 7pm - call Pager - [phone]  After 7pm go to [email or web address] - password [email or web address]  Call night coverage person covering after 7pm

## 2014-12-05 NOTE — Progress Notes (Signed)
NCM gave patient follow up information for the [facility] with the phone number.

## 2014-12-07 ENCOUNTER — Inpatient Hospital Stay: Admitting: Family Medicine

## 2015-01-10 ENCOUNTER — Encounter (HOSPITAL_COMMUNITY): Payer: Self-pay

## 2015-01-10 ENCOUNTER — Emergency Department (HOSPITAL_COMMUNITY): Payer: Self-pay

## 2015-01-10 ENCOUNTER — Emergency Department (HOSPITAL_COMMUNITY)
Admission: EM | Admit: 2015-01-10 | Discharge: 2015-01-10 | Disposition: A | Discharge status: Home / Self Care | Payer: Self-pay | Attending: Emergency Medicine | Admitting: Emergency Medicine

## 2015-01-10 DIAGNOSIS — Z79899 Other long term (current) drug therapy: Secondary | ICD-10-CM | POA: Insufficient documentation

## 2015-01-10 DIAGNOSIS — R531 Weakness: Secondary | ICD-10-CM | POA: Insufficient documentation

## 2015-01-10 DIAGNOSIS — F1010 Alcohol abuse, uncomplicated: Secondary | ICD-10-CM | POA: Insufficient documentation

## 2015-01-10 DIAGNOSIS — Z8673 Personal history of transient ischemic attack (TIA), and cerebral infarction without residual deficits: Secondary | ICD-10-CM | POA: Insufficient documentation

## 2015-01-10 DIAGNOSIS — Z7982 Long term (current) use of aspirin: Secondary | ICD-10-CM | POA: Insufficient documentation

## 2015-01-10 DIAGNOSIS — F1410 Cocaine abuse, uncomplicated: Secondary | ICD-10-CM | POA: Insufficient documentation

## 2015-01-10 DIAGNOSIS — Z720 Tobacco use: Secondary | ICD-10-CM | POA: Insufficient documentation

## 2015-01-10 LAB — RAPID URINE DRUG SCREEN, HOSP PERFORMED
AMPHETAMINES: NOT DETECTED
BARBITURATES: NOT DETECTED
BENZODIAZEPINES: NOT DETECTED
Cocaine: POSITIVE — AB
Opiates: NOT DETECTED
Tetrahydrocannabinol: NOT DETECTED

## 2015-01-10 LAB — URINALYSIS, ROUTINE W REFLEX MICROSCOPIC
BILIRUBIN URINE: NEGATIVE
Glucose, UA: NEGATIVE mg/dL
HGB URINE DIPSTICK: NEGATIVE
Ketones, ur: NEGATIVE mg/dL
LEUKOCYTES UA: NEGATIVE
Nitrite: NEGATIVE
Protein, ur: NEGATIVE mg/dL
Specific Gravity, Urine: 1.004 — ABNORMAL LOW (ref 1.005–1.030)
UROBILINOGEN UA: 0.2 mg/dL (ref 0.0–1.0)
pH: 6 (ref 5.0–8.0)

## 2015-01-10 LAB — BASIC METABOLIC PANEL
ANION GAP: 11 (ref 5–15)
BUN: 12 mg/dL (ref 6–20)
CO2: 22 mmol/L (ref 22–32)
Calcium: 9 mg/dL (ref 8.9–10.3)
Chloride: 104 mmol/L (ref 101–111)
Creatinine, Ser: 1.1 mg/dL (ref 0.61–1.24)
GFR calc Af Amer: >60 mL/min (ref >60)
Glucose, Bld: 107 mg/dL — ABNORMAL HIGH (ref 65–99)
POTASSIUM: 3.8 mmol/L (ref 3.5–5.1)
Sodium: 137 mmol/L (ref 135–145)

## 2015-01-10 LAB — ETHANOL: Alcohol, Ethyl (B): 279 mg/dL — ABNORMAL HIGH (ref <5)

## 2015-01-10 LAB — CBC WITH DIFFERENTIAL/PLATELET
Basophils Absolute: 0.1 10*3/uL (ref 0.0–0.1)
Basophils Relative: 1 % (ref 0–1)
EOS ABS: 0.3 10*3/uL (ref 0.0–0.7)
EOS PCT: 3 % (ref 0–5)
HCT: 45.6 % (ref 39.0–52.0)
HEMOGLOBIN: 15.6 g/dL (ref 13.0–17.0)
Lymphocytes Relative: 50 % — ABNORMAL HIGH (ref 12–46)
Lymphs Abs: 4.5 10*3/uL — ABNORMAL HIGH (ref 0.7–4.0)
MCH: 31.4 pg (ref 26.0–34.0)
MCHC: 34.2 g/dL (ref 30.0–36.0)
MCV: 91.8 fL (ref 78.0–100.0)
MONO ABS: 0.6 10*3/uL (ref 0.1–1.0)
Monocytes Relative: 7 % (ref 3–12)
Neutro Abs: 3.5 10*3/uL (ref 1.7–7.7)
Neutrophils Relative %: 39 % — ABNORMAL LOW (ref 43–77)
PLATELETS: 280 10*3/uL (ref 150–400)
RBC: 4.97 MIL/uL (ref 4.22–5.81)
RDW: 13.3 % (ref 11.5–15.5)
WBC: 9.1 10*3/uL (ref 4.0–10.5)

## 2015-01-10 LAB — PROTIME-INR
INR: 0.96 (ref 0.00–1.49)
Prothrombin Time: 13 seconds (ref 11.6–15.2)

## 2015-01-10 LAB — APTT: aPTT: 30 seconds (ref 24–37)

## 2015-01-10 LAB — TROPONIN I: Troponin I: <0.03 ng/mL (ref <0.031)

## 2015-01-10 MED ORDER — FENTANYL CITRATE (PF) 100 MCG/2ML IJ SOLN
50.0000 ug | Freq: Once | INTRAMUSCULAR | Status: AC
  Administered 2015-01-10: 06:00:00 via INTRAVENOUS
  Filled 2015-01-10: qty 2

## 2015-01-10 MED ORDER — SODIUM CHLORIDE 0.9 % IV BOLUS (SEPSIS)
1000.0000 mL | Freq: Once | INTRAVENOUS | Status: AC
  Administered 2015-01-10: 1000 mL via INTRAVENOUS

## 2015-01-10 NOTE — Discharge Instructions (Signed)
Alcohol Intoxication °Alcohol intoxication occurs when the amount of alcohol that a person has consumed impairs his or her ability to mentally and physically function. Alcohol directly impairs the normal chemical activity of the brain. Drinking large amounts of alcohol can lead to changes in mental function and behavior, and it can cause many physical effects that can be harmful.  °Alcohol intoxication can range in severity from mild to very severe. Various factors can affect the level of intoxication that occurs, such as the person's age, gender, weight, frequency of alcohol consumption, and the presence of other medical conditions (such as diabetes, seizures, or heart conditions). Dangerous levels of alcohol intoxication may occur when people drink large amounts of alcohol in a short period (binge drinking). Alcohol can also be especially dangerous when combined with certain prescription medicines or "recreational" drugs. °SIGNS AND SYMPTOMS °Some common signs and symptoms of mild alcohol intoxication include: °· Loss of coordination. °· Changes in mood and behavior. °· Impaired judgment. °· Slurred speech. °As alcohol intoxication progresses to more severe levels, other signs and symptoms will appear. These may include: °· Vomiting. °· Confusion and impaired memory. °· Slowed breathing. °· Seizures. °· Loss of consciousness. °DIAGNOSIS  °Your health care provider will take a medical history and perform a physical exam. You will be asked about the amount and type of alcohol you have consumed. Blood tests will be done to measure the concentration of alcohol in your blood. In many places, your blood alcohol level must be lower than 80 mg/dL (0.08%) to legally drive. However, many dangerous effects of alcohol can occur at much lower levels.  °TREATMENT  °People with alcohol intoxication often do not require treatment. Most of the effects of alcohol intoxication are temporary, and they go away as the alcohol naturally  leaves the body. Your health care provider will monitor your condition until you are stable enough to go home. Fluids are sometimes given through an IV access tube to help prevent dehydration.  °HOME CARE INSTRUCTIONS °· Do not drive after drinking alcohol. °· Stay hydrated. Drink enough water and fluids to keep your urine clear or pale yellow. Avoid caffeine.   °· Only take over-the-counter or prescription medicines as directed by your health care provider.   °SEEK MEDICAL CARE IF:  °· You have persistent vomiting.   °· You do not feel better after a few days. °· You have frequent alcohol intoxication. Your health care provider can help determine if you should see a substance use treatment counselor. °SEEK IMMEDIATE MEDICAL CARE IF:  °· You become shaky or tremble when you try to stop drinking.   °· You shake uncontrollably (seizure).   °· You throw up (vomit) blood. This may be bright red or may look like black coffee grounds.   °· You have blood in your stool. This may be bright red or may appear as a black, tarry, bad smelling stool.   °· You become lightheaded or faint.   °MAKE SURE YOU:  °· Understand these instructions. °· Will watch your condition. °· Will get help right away if you are not doing well or get worse. °Document Released: 05/07/2005 Document Revised: 03/30/2013 Document Reviewed: 12/31/2012 °ExitCare® Patient Information ©2015 [employer]. This information is not intended to replace advice given to you by your health care provider. Make sure you discuss any questions you have with your health care provider. ° ° °Emergency Department Resource Guide °1) Find a Doctor and Pay Out of Pocket °Although you won't have to find out who is covered by your   insurance plan, it is a good idea to ask around and get recommendations. You will then need to call the office and see if the doctor you have chosen will accept you as a new patient and what types of options they offer for patients who are self-pay.  Some doctors offer discounts or will set up payment plans for their patients who do not have insurance, but you will need to ask so you aren't surprised when you get to your appointment. ° °2) Contact Your Local Health Department °Not all health departments have doctors that can see patients for sick visits, but many do, so it is worth a call to see if yours does. If you don't know where your local health department is, you can check in your phone book. The CDC also has a tool to help you locate your state's health department, and many state websites also have listings of all of their local health departments. ° °3) Find a Walk-in Clinic °If your illness is not likely to be very severe or complicated, you may want to try a walk in clinic. These are popping up all over the country in pharmacies, drugstores, and shopping centers. They're usually staffed by nurse practitioners or physician assistants that have been trained to treat common illnesses and complaints. They're usually fairly quick and inexpensive. However, if you have serious medical issues or chronic medical problems, these are probably not your best option. ° °No Primary Care Doctor: °- Call [facility] at  832-8000 - they can help you locate a primary care doctor that  accepts your insurance, provides certain services, etc. °- Physician Referral Service- 1-800-533-3463 ° °Chronic Pain Problems: °Organization         Address  Phone   Notes  °[facility]  (336) 297-2271 Patients need to be referred by their primary care doctor.  ° °Medication Assistance: °Organization         Address  Phone   Notes  °[place] [facility] [address] °[place], [place] [address] (336) 641-8030 --Must be a resident of [place] °-- Must have NO insurance coverage whatsoever (no Medicaid/ Medicare, etc.) °-- The pt. MUST have a primary care doctor that directs their care regularly and follows them in the  community °  °[facility]  (866) 331-1348   °[employer]  (888) 892-1162   ° °Agencies that provide inexpensive medical care: °Organization         Address  Phone   Notes  °[facility]  (336) 832-8035   °[facility]    (336) 832-7272   °[facility] [address] °[place], [place] [address] (336) 832-4777   °[facility] [address], °[place] (336) 271-4999   °[facility]    (336) 373-0678   °[facility]    (336) 272-1050   °[facility] ° [address], [place] Phone:  (336) 832-4444, Fax:  (336) 832-4440 Hours of Operation:  9 am - 6 pm, M-F.  Also accepts Medicaid/Medicare and self-pay.  °[facility] ° [address], [place] Phone: (336) [phone], Fax: (336) [phone]. Hours of Operation:  8:30 am - 5:30 pm, M-F.  Also accepts Medicaid and self-pay.  °[facility] [place] [address], [place] Phone: (336) 878-6027   °[facility] [address], [place], [place] (336)723-1848, Ext. 123 Mondays &   Thursdays: 7-9 AM.  First 15 patients are seen on a first come, first serve basis. °  ° °Medicaid-accepting [place] Providers: ° °Organization         Address  Phone   Notes  °[facility] [address], [place] (336) 641-2100 Also accepts self-pay patients.  °[facility] [address], [place] ° (336) 856-9996   °[facility] [address], [place] (336) 288-8857   °[facility] [address] [place] [address], [place] (336) 299-7000   °Veita Bland [address], [place]  ° (336) 373-1557 Only accepts [place] Access Medicaid patients after they have their name applied to their card.  ° °Self-Pay (no insurance) in [place]: ° °Organization         Address  Phone   Notes  °[facility] [address], [place] (336) 832-1970   °[facility] [address], [place] (336) 832-4400   °[facility] ° [address], [place] (336) 992-4800   °[facility]/Dr. Osei-Bonsu ° [address] [place] [address], [place] or [address], [place] (336) [phone] Phone number for both [place] and [place] locations is the same.  °[facility] [address], [place] (336) 299-0000   °[facility] [address] [place] [address], [place] or [address] (336) 852-7530 °(336) 878-2260   °[facility] [address], [place] (336) 350-1642, phone; (336) 294-5005, fax Sees patients 1st and 3rd Saturday of every month.  Must not qualify for public or private insurance (i.e. Medicaid, Medicare, [place] Health Choice, Veterans' Benefits) • Household income should be no more than 200% of the poverty level •The clinic cannot treat you if you are pregnant or think you are pregnant • Sexually transmitted diseases are not treated at the clinic.  ° ° °Dental Care: °Organization         Address  Phone  Notes  °[facility] [address], [place] (336) 641-6152 Accepts children up to age 21 who are enrolled in Medicaid or [place] Health Choice; pregnant women with a Medicaid card; and children who have applied for Medicaid or [place] Health Choice, but were declined, whose parents can pay a reduced fee at time of service.  °[facility] [place]  [address], [place] (336) 641-7733 Accepts children up to age 21 who are enrolled in Medicaid or [place] Health Choice; pregnant women with a Medicaid card; and children who have applied for Medicaid or [place] Health Choice, but were declined, whose parents can pay a reduced fee at time of service.  °[facility] ° [address], [place] (336) 641-4533 Patients are seen by appointment only. Walk-ins are not accepted. [facility] will see patients 18 years of age and older. °Monday - Tuesday (8am-5pm) °Most Wednesdays (8:30-5pm) °$30 per visit, cash only  °[facility] ° [address], [place] (336) 641-4533 Patients are seen by appointment only. Walk-ins are not accepted. [facility] will see patients 18 years of age and older. °One Wednesday Evening (Monthly: Volunteer Based).  $30 per visit, cash only  °[facility]  (919) 537-3737 for adults; Children under   age 4, call Graduate Pediatric Dentistry at ([phone]) [phone]. Children aged 4-14, please call (919) [phone] to request a pediatric application. ° Dental services are provided in all areas of dental care including fillings, crowns and bridges, complete and partial dentures, implants, gum treatment, root canals, and extractions. Preventive care is also provided. Treatment is provided to both adults and children. °Patients are selected via a lottery and there is often a waiting list. °  °[facility] [address], °[place] ° (336) 763-8833 [email or web address] °  °[facility] [address], [place], [place] (336)723-1848, Ext. 123 Second and Fourth Thursday of each month, opens at 6:30 AM; Clinic ends at 9 AM.  Patients are seen on a first-come first-served basis, and a limited number are seen during each clinic.  ° °[facility] ° [address], [place], [place] (336) 723-7904   Eligibility Requirements °You must have lived in [place], [place], or [place] counties for at least the last three months. °  You cannot be eligible for state or federal sponsored healthcare insurance, including Veterans Administration, Medicaid, or Medicare. °  You generally cannot be eligible for healthcare insurance through your employer.  °  How to apply: °Eligibility screenings are held every  Tuesday and Wednesday afternoon from 1:00 pm until 4:00 pm. You do not need an appointment for the interview!  °[facility] [address], [place], [place] 336-631-2330   °[facility]  336-342-8273   °[facility]  336-703-3100   °[facility]  336-570-6415   ° °Behavioral Health Resources in the Community: °Intensive Outpatient Programs °Organization         Address  Phone  Notes  °[facility] [address], [place], [place] 336-878-6098   °[facility] [address], [place], [place] 336-832-9800   °[facility] [address], [place], [place] ° 336-882-2125   °[facility] [address],  °[place], [place] 1-800-853-5163 or 336-641-4981   °Substance Abuse Resources °Organization         Address  Phone  Notes  °[facility]  336-882-2125   °[facility]  336-784-9470   °[facility]  336-285-9073   °[facility]  336-845-3988   °Residential & Outpatient Substance Abuse Program  1-800-659-3381   °Psychological Services °Organization         Address  Phone  Notes  °[facility]  336- 832-9600   °[facility]  336- 378-7881   °[facility] [address], [place] 1-800-853-5163 or 336-641-4981   ° °Mobile Crisis Teams °Organization         Address  Phone  Notes  °[facility], Mobile Crisis Care Unit  1-877-626-1772   °Assertive °[facility] ° [address] [place], [place] 336-834-9664   °Sharon DeEsch [address] °[place] 336-554-5454   ° °Self-Help/Support Groups °Organization         Address  Phone             Notes  °[facility] - variety of support groups  336- 373-1402 Call for more information  °Narcotics Anonymous (NA), [facility] [address], °[place]  2 meetings at this location   ° °Residential Treatment Programs °Organization         Address  Phone  Notes  °[facility] [address],    °[place]  1-866-801-8205   °[facility] ° [address], [place], [place] 704-293-8524   °[facility] [address], [place] 336-845-3988 Admissions: 8am-3pm M-F  °[facility] [address]    °[place], [place] 336-841-1104   °[facility] [address], [place], [place] 336-379-7146   °[facility] [address]  °[place], [place] 336-285-9073   °[facility] Intensive Outpatient [address], [place], [place] 336-852-3033   °[facility] [address]  °[place], [place] 1-877-615-2722 or 336-784-9470   °[facility] [address] [place], [place] 336-227-7417 Accepts Medicaid  °[facility] [address]  °[place] 1-800-659-3381 Substance Abuse/Addiction Treatment  ° °[place] Behavioral Health Resources °Organization         Address  Phone  Notes  °[facility]  (888) 581-9988   °Julie Brannon, PhD [address] [place], [place]   (336) 349-5553 or (336) 951-0000   °[facility]   [address] °[place], [place] (336) 349-4454   °[facility] [address], [place], [place] (336) 342-8316 Insurance/Medicaid/sponsorship through [facility]  °[facility] [address]                                    [place], [place] (336) 342-8316 Therapy/tele-psych/case  °[facility] [address]  ° [place], [place] (336) 349-2233    °Dr. Arfeen  (336) 349-4544   °[facility] [place]  [employer] [place] [facility] 1) [address],  °2) [address], [place] °3)  [address], [place] (336) 349-3220 °(336) 342-7768 ° °(336) 342-8140   °[place] [facility] (336) 342-1394 or (336) 342-3537 (After  Hours)    ° ° ° °

## 2015-01-10 NOTE — ED Provider Notes (Signed)
MRI w/o acute abnormality. Apparently he can ambulate although with limp. Hx of similar presentations. It has been determined that no acute conditions requiring further emergency intervention are present at this time.    Raeford RazorStephen Daysie Helf, MD 01/10/15 [phone]

## 2015-01-10 NOTE — ED Notes (Signed)
Pt's friend reports that the last time he saw him normal was around 4pm yesterday.

## 2015-01-10 NOTE — ED Notes (Signed)
Patient transported to MRI 

## 2015-01-10 NOTE — ED Notes (Addendum)
Pt presents with c/o right sided facial droop and alcohol intoxication. Pt is slurring his words at this time. Pt reports that he feels disoriented, poor historian. Pt reports he does not know his first or last name or where he is. Pt has facial droop on the right side and is not able to hold his right arm up at all. Pt also reported to staff that he was doing drugs tonight as well.

## 2015-01-10 NOTE — ED Notes (Signed)
Dr. Rhunette CroftNanavati made aware of pt's symptoms and he will be up to triage to assess pt.

## 2015-01-10 NOTE — ED Provider Notes (Addendum)
CSN: [id number]     Arrival date & time 01/10/15  0310 History   First MD Initiated Contact with Patient 01/10/15 0327     Chief Complaint  Patient presents with  . Facial Droop  . Alcohol Intoxication     (Consider location/radiation/quality/duration/timing/severity/associated sxs/prior Treatment) HPI Comments: LEVEL 5 CAVEAT, PT IS INTOXICATED. Pt with alleged hx of strokes comes in with cc of right sided facial droop. Per RN, pt was last normal at 3 pm. Pt then proceeded to go to a strip club with his friend, and per patient he had a fall and so he was brought to the ER. He has pain in the back. He also has numbness on his right side. Pt is unaware when he was last normal - but he reports that he was fine when he was at the strip club. He was in the strip club in the evening.   Patient is a 29 y.o. male presenting with intoxication. The history is provided by the patient.  Alcohol Intoxication    Past Medical History  Diagnosis Date  . Stroke    Past Surgical History  Procedure Laterality Date  . Wisdom tooth extraction     No family history on file. History  Substance Use Topics  . Smoking status: Current Every Day Smoker -- 0.50 packs/day for 15 years    Types: Cigarettes  . Smokeless tobacco: Not on file  . Alcohol Use: 0.0 oz/week     Comment: 2 pints a week    Review of Systems  All other systems reviewed and are negative.     Allergies  Review of patient's allergies indicates no known allergies.  Home Medications   Prior to Admission medications   Medication Sig Start Date End Date Taking? Authorizing Provider  aspirin 81 MG EC tablet Take 1 tablet (81 mg total) by mouth daily. 12/04/14  Yes Ripudeep Jenna Luo, MD  FLUoxetine (PROZAC) 20 MG capsule Take 1 capsule (20 mg total) by mouth daily. 12/04/14  Yes Ripudeep Jenna Luo, MD  gabapentin (NEURONTIN) 600 MG tablet Take 0.5 tablets (300 mg total) by mouth 2 (two) times daily. 12/04/14  Yes Ripudeep Jenna Luo, MD   ibuprofen (ADVIL,MOTRIN) 200 MG tablet Take 200 mg by mouth every 6 (six) hours as needed.   Yes Historical Provider, MD  oxyCODONE-acetaminophen (PERCOCET/ROXICET) 5-325 MG per tablet Take 1-2 tablets by mouth every 6 (six) hours as needed for severe pain. No refills, Further refills by primary MD 12/04/14  Yes Ripudeep Jenna Luo, MD  EPINEPHrine (EPIPEN) 0.3 mg/0.3 mL SOAJ injection Inject 0.3 mLs (0.3 mg total) into the muscle as needed. Patient not taking: Reported on 12/02/2014 06/07/13   Jerelyn Scott, MD  nicotine (NICODERM CQ - DOSED IN MG/24 HOURS) 14 mg/24hr patch Place 1 patch (14 mg total) onto the skin daily. Patient not taking: Reported on 01/10/2015 12/04/14   Ripudeep Jenna Luo, MD  traMADol (ULTRAM) 50 MG tablet Take 1 tablet (50 mg total) by mouth every 6 (six) hours as needed for moderate pain. Patient not taking: Reported on 01/10/2015 12/04/14   Ripudeep K Rai, MD   BP 135/94 mmHg  Pulse 77  Temp(Src) 98.6 F (37 C) (Oral)  Resp 14  Wt 192 lb (87.091 kg)  SpO2 96% Physical Exam  Constitutional: He is oriented to person, place, and time. He appears well-developed and well-nourished.  HENT:  Head: Normocephalic and atraumatic.  Eyes: EOM are normal. Pupils are equal, round, and reactive to light.  Neck: Normal range of motion. Neck supple. No JVD present.  Cardiovascular: Normal rate and regular rhythm.   Pulmonary/Chest: Effort normal and breath sounds normal. No respiratory distress. He has no wheezes.  Abdominal: Soft. Bowel sounds are normal. He exhibits no distension. There is no tenderness. There is no rebound and no guarding.  Neurological: He is alert and oriented to person, place, and time.  Right sided fascial droop, Right sided upper and lower extremity strength is 1/5. Pt has no sensation to the right side- face, upper or lower extremity.   Skin: Skin is warm and dry.  Nursing note and vitals reviewed.   ED Course  Procedures (including critical care time) Labs  Review Labs Reviewed  CBC WITH DIFFERENTIAL/PLATELET - Abnormal; Notable for the following:    Neutrophils Relative % 39 (*)    Lymphocytes Relative 50 (*)    Lymphs Abs 4.5 (*)    All other components within normal limits  BASIC METABOLIC PANEL - Abnormal; Notable for the following:    Glucose, Bld 107 (*)    All other components within normal limits  ETHANOL - Abnormal; Notable for the following:    Alcohol, Ethyl (B) 279 (*)    All other components within normal limits  URINALYSIS, ROUTINE W REFLEX MICROSCOPIC (NOT AT [facility]) - Abnormal; Notable for the following:    Specific Gravity, Urine 1.004 (*)    All other components within normal limits  URINE RAPID DRUG SCREEN (HOSP PERFORMED) NOT AT [facility] - Abnormal; Notable for the following:    Cocaine POSITIVE (*)    All other components within normal limits  PROTIME-INR  TROPONIN I  APTT    Imaging Review Dg Thoracic Spine 2 View  01/10/2015   CLINICAL DATA:  Larey SeatFell at home yesterday, seizure like activity. Low back pain.  EXAM: THORACIC SPINE - 2 VIEW; LUMBAR SPINE - COMPLETE 4+ VIEW  COMPARISON:  MRI of the thoracolumbar spine December 03, 2014  FINDINGS: There is no evidence of thoracic spine fracture. Alignment is normal. No other significant bone abnormalities are identified.  Five non rib-bearing lumbar-type vertebral bodies are intact and aligned with maintenance of the lumbar lordosis. Mild sacralized L5. No pars interarticularis defects. Intervertebral disc heights are normal. No destructive bony lesions.  Sacroiliac joints are symmetric. Included prevertebral and paraspinal soft tissue planes are non-suspicious.  IMPRESSION: Negative.   Electronically Signed   By: Awilda Metroourtnay  Bloomer M.D.   On: 01/10/2015 04:09   Dg Lumbar Spine Complete  01/10/2015   CLINICAL DATA:  Larey SeatFell at home yesterday, seizure like activity. Low back pain.  EXAM: THORACIC SPINE - 2 VIEW; LUMBAR SPINE - COMPLETE 4+ VIEW  COMPARISON:  MRI of the thoracolumbar spine  December 03, 2014  FINDINGS: There is no evidence of thoracic spine fracture. Alignment is normal. No other significant bone abnormalities are identified.  Five non rib-bearing lumbar-type vertebral bodies are intact and aligned with maintenance of the lumbar lordosis. Mild sacralized L5. No pars interarticularis defects. Intervertebral disc heights are normal. No destructive bony lesions.  Sacroiliac joints are symmetric. Included prevertebral and paraspinal soft tissue planes are non-suspicious.  IMPRESSION: Negative.   Electronically Signed   By: Awilda Metroourtnay  Bloomer M.D.   On: 01/10/2015 04:09   Ct Head Wo Contrast  01/10/2015   CLINICAL DATA:  RIGHT facial droop, alcohol intoxication and drug intake with slurred speech. Poor historian.  EXAM: CT HEAD WITHOUT CONTRAST  CT CERVICAL SPINE WITHOUT CONTRAST  TECHNIQUE: Multidetector CT imaging of  the head and cervical spine was performed following the standard protocol without intravenous contrast. Multiplanar CT image reconstructions of the cervical spine were also generated.  COMPARISON:  MRI brain December 02, 2014  FINDINGS: CT HEAD FINDINGS  The ventricles and sulci are normal. No intraparenchymal hemorrhage, mass effect nor midline shift. No acute large vascular territory infarcts.  No abnormal extra-axial fluid collections. Basal cisterns are patent.  No skull fracture. Persistent metopic suture. The included ocular globes and orbital contents are non-suspicious. The mastoid aircells and included paranasal sinuses are well-aerated.  CT CERVICAL SPINE FINDINGS  Cervical vertebral bodies and posterior elements are intact and aligned with straightened cervical lordosis. Intervertebral disc heights preserved. No destructive bony lesions. C1-2 articulation maintained. Included prevertebral and paraspinal soft tissues are unremarkable.  IMPRESSION: CT HEAD: No acute intracranial process; normal noncontrast CT Head.  CT CERVICAL SPINE: Straightened cervical lordosis  without acute fracture or malalignment.   Electronically Signed   By: Awilda Metro M.D.   On: 01/10/2015 05:04   Ct Cervical Spine Wo Contrast  01/10/2015   CLINICAL DATA:  RIGHT facial droop, alcohol intoxication and drug intake with slurred speech. Poor historian.  EXAM: CT HEAD WITHOUT CONTRAST  CT CERVICAL SPINE WITHOUT CONTRAST  TECHNIQUE: Multidetector CT imaging of the head and cervical spine was performed following the standard protocol without intravenous contrast. Multiplanar CT image reconstructions of the cervical spine were also generated.  COMPARISON:  MRI brain December 02, 2014  FINDINGS: CT HEAD FINDINGS  The ventricles and sulci are normal. No intraparenchymal hemorrhage, mass effect nor midline shift. No acute large vascular territory infarcts.  No abnormal extra-axial fluid collections. Basal cisterns are patent.  No skull fracture. Persistent metopic suture. The included ocular globes and orbital contents are non-suspicious. The mastoid aircells and included paranasal sinuses are well-aerated.  CT CERVICAL SPINE FINDINGS  Cervical vertebral bodies and posterior elements are intact and aligned with straightened cervical lordosis. Intervertebral disc heights preserved. No destructive bony lesions. C1-2 articulation maintained. Included prevertebral and paraspinal soft tissues are unremarkable.  IMPRESSION: CT HEAD: No acute intracranial process; normal noncontrast CT Head.  CT CERVICAL SPINE: Straightened cervical lordosis without acute fracture or malalignment.   Electronically Signed   By: Awilda Metro M.D.   On: 01/10/2015 05:04   Mr Brain Wo Contrast  01/10/2015   CLINICAL DATA:  Right-sided facial droop. Slurred speech. Right arm weakness.  EXAM: MRI HEAD WITHOUT CONTRAST  TECHNIQUE: Multiplanar, multiecho pulse sequences of the brain and surrounding structures were obtained without intravenous contrast.  COMPARISON:  Head CT same day.  MRI 12/02/2014  FINDINGS: No change since the  prior study. The brain has a normal appearance on all pulse sequences without evidence of malformation, atrophy, old or acute infarction, mass lesion, hemorrhage, hydrocephalus or extra-axial collection. No pituitary mass. No fluid in the sinuses, middle ears or mastoids. No skull or skullbase lesion. There is flow in the major vessels at the base of the brain. Major venous sinuses show flow.  IMPRESSION: No change.  Normal MRI of the brain .   Electronically Signed   By: Paulina Fusi M.D.   On: 01/10/2015 07:39     EKG Interpretation   Date/Time:  Wednesday January 10 2015 03:13:34 EDT Ventricular Rate:  87 PR Interval:  163 QRS Duration: 90 QT Interval:  354 QTC Calculation: 426 R Axis:   78 Text Interpretation:  Sinus rhythm No old tracing to compare normal ekg  Confirmed by Rhunette Croft, MD, Janey Genta (  [id number]) on 01/10/2015 4:48:42 AM      MRI HISTORIES:  EXAM: MRI HEAD WITHOUT CONTRAST  MRA HEAD WITHOUT CONTRAST  TECHNIQUE: Multiplanar, multiecho pulse sequences of the brain and surrounding structures were obtained without intravenous contrast. Angiographic images of the head were obtained using MRA technique without contrast.  COMPARISON: None.  FINDINGS: MRI HEAD FINDINGS  The study does suffer from some motion degradation. Diffusion imaging does not show any acute or subacute infarction. The brainstem and cerebellum are unremarkable. The cerebral hemispheres appear normal without evidence of any old or acute infarction. No mass lesion, hemorrhage, hydrocephalus or extra-axial collection. No inflammatory sinus disease. No skull or skullbase lesion.  MRA HEAD FINDINGS  Both internal carotid arteries are widely patent into the brain. The anterior and middle cerebral vessels appear normal without proximal stenosis, aneurysm or vascular malformation. Both vertebral arteries are patent to the basilar. No basilar stenosis. Posterior circulation branch vessels appear  normal. The study does show degradation from motion.  IMPRESSION: Some motion degradation. Allowing for that, normal appearance of the brain. No evidence of old or acute infarction. No abnormality of the intracranial vasculature demonstrated.   Electronically Signed  By: Paulina Fusi M.D.  On: 07/23/2014 10:58     EXAM: MRI HEAD WITHOUT CONTRAST  TECHNIQUE: Multiplanar, multiecho pulse sequences of the brain and surrounding structures were obtained without intravenous contrast.  COMPARISON: Prior CT from earlier the same day.  FINDINGS: The CSF containing spaces are within normal limits for patient age. No focal parenchymal signal abnormality is identified. No mass lesion, midline shift, or extra-axial fluid collection. Ventricles are normal in size without evidence of hydrocephalus.  No diffusion-weighted signal abnormality is identified to suggest acute intracranial infarct. Gray-white matter differentiation is maintained. Normal flow voids are seen within the intracranial vasculature. No intracranial hemorrhage identified.  The cervicomedullary junction is normal. Pituitary gland is within normal limits. Pituitary stalk is midline. The globes and optic nerves demonstrate a normal appearance with normal signal intensity. The  The bone marrow signal intensity is normal. Calvarium is intact. Visualized upper cervical spine is within normal limits.  Scalp soft tissues are unremarkable.  Paranasal sinuses are clear. No mastoid effusion.  IMPRESSION: Normal brain MRI with no acute intracranial infarct or other abnormality identified.   Electronically Signed  By: Rise Mu M.D.  On: 12/02/2014 22:51    EXAM: MRI THORACIC SPINE WITHOUT CONTRAST  TECHNIQUE: Multiplanar, multisequence MR imaging of the thoracic spine was performed. No intravenous contrast was administered.  COMPARISON: MRI brain  12/02/2014  FINDINGS: Normal signal is present in the thoracic spinal cord to the T12-L1 level. Marrow signal, vertebral body heights, alignment are normal. Disc signal and heights are normal. There is no significant disc protrusion or stenosis. The foramina are patent bilaterally.  IMPRESSION: Negative MRI of the thoracic spine.   Electronically Signed  By: Marin Roberts M.D.  On: 12/03/2014 12:07   EXAM: MRI LUMBAR SPINE WITHOUT CONTRAST  TECHNIQUE: Multiplanar, multisequence MR imaging of the lumbar spine was performed. No intravenous contrast was administered.  COMPARISON: Lumbar spine radiographs 04/16/2014.  FINDINGS: Normal signal is present in the conus medullaris which terminates at L2. Marrow signal, vertebral body heights, alignment are normal in the lumbar spine. A 7 mm sclerotic lesion is present within the right sacral ala. This is incompletely imaged.  Limited imaging the abdomen is unremarkable. There is no significant adenopathy.  No significant disc protrusion or stenosis is present. The disc signal and height is preserved. The  foramina are patent bilaterally.  IMPRESSION: 1. Normal MRI appearance of the lumbar spine. 2. Benign appearing 7 mm sclerotic lesion in the right sacral ala.   MDM   Final diagnoses:  Right sided weakness  Alcohol abuse    Pt comes in with right sided fascial droop and fall. He is intoxicated, and his exam is severely limited due to his intoxication - grossly he is noted to have a right sided facial droop and R sided upper and lower ext weakness and numbness on the right side. He has no signs of trauma, but has tenderness to the thoracic and lumbar spine. Will get CT head, cspine and T+L spine films.  Unknown last normal. Chart review shows 2 visits to the ER for right sided weakness and subsequent negative MRI - and i have pasted those results in the chart above.  Pt has hx of cocaine abuse - so  he has risk for strokes. Will continue to monitor. Neuro consulted-  They agree that pt is not in tpa window, and they will see the patient at [facility] if his symptoms dont resolve or he has an actual stroke.    Derwood Kaplan, MD 01/10/15 0353  :20 - pt had reassessments x 2 - and he still has right sided weakness and numbness - the numbness is clear, because he is niot responding to noxious stimuli. Will get MRI brain, if neg, will await for sobriety.   Derwood Kaplan, MD 01/10/15 [phone]

## 2015-01-10 NOTE — ED Notes (Signed)
Patient repeatedly asks for pain medication.  Continues to report right-sided heaviness in both his arm and leg.  He has been stopped by staff limping down the hallway.

## 2017-05-07 IMAGING — CR DG LUMBAR SPINE COMPLETE 4+V
5 series · 5 of 5 positions shown · non-contrast
Comparison: MRI of the thoracolumbar spine December 03, 2014

CLINICAL DATA: Fell at home yesterday, seizure like activity. Low
back pain.

EXAM:
THORACIC SPINE - 2 VIEW; LUMBAR SPINE - COMPLETE 4+ VIEW

[t lumbar spine ap]
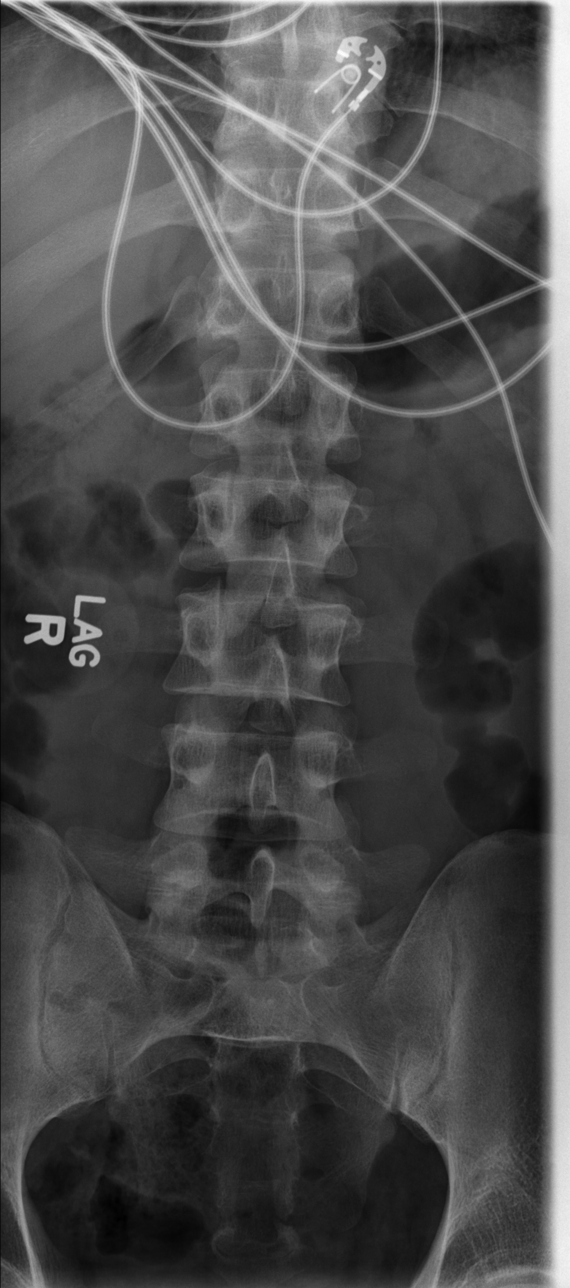

[t lumbar spine obl (1 of 2)]
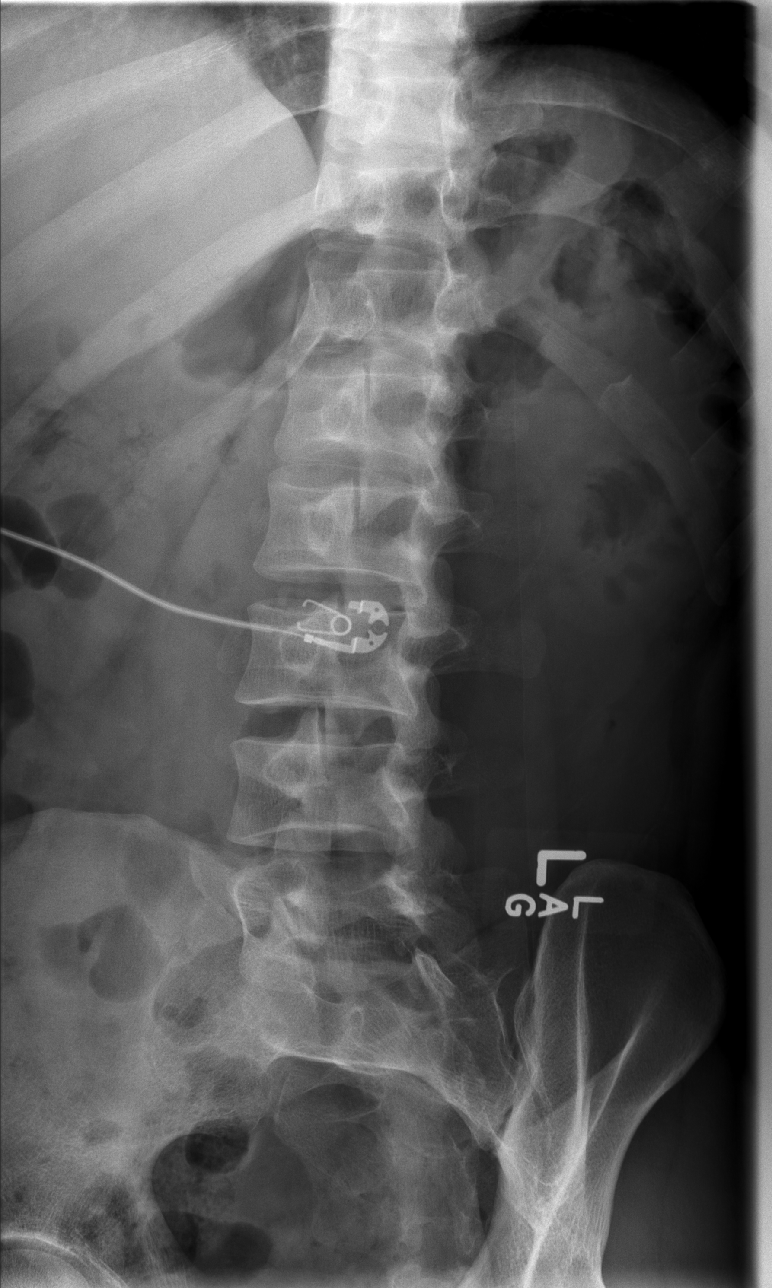

[t lumbar spine obl (2 of 2)]
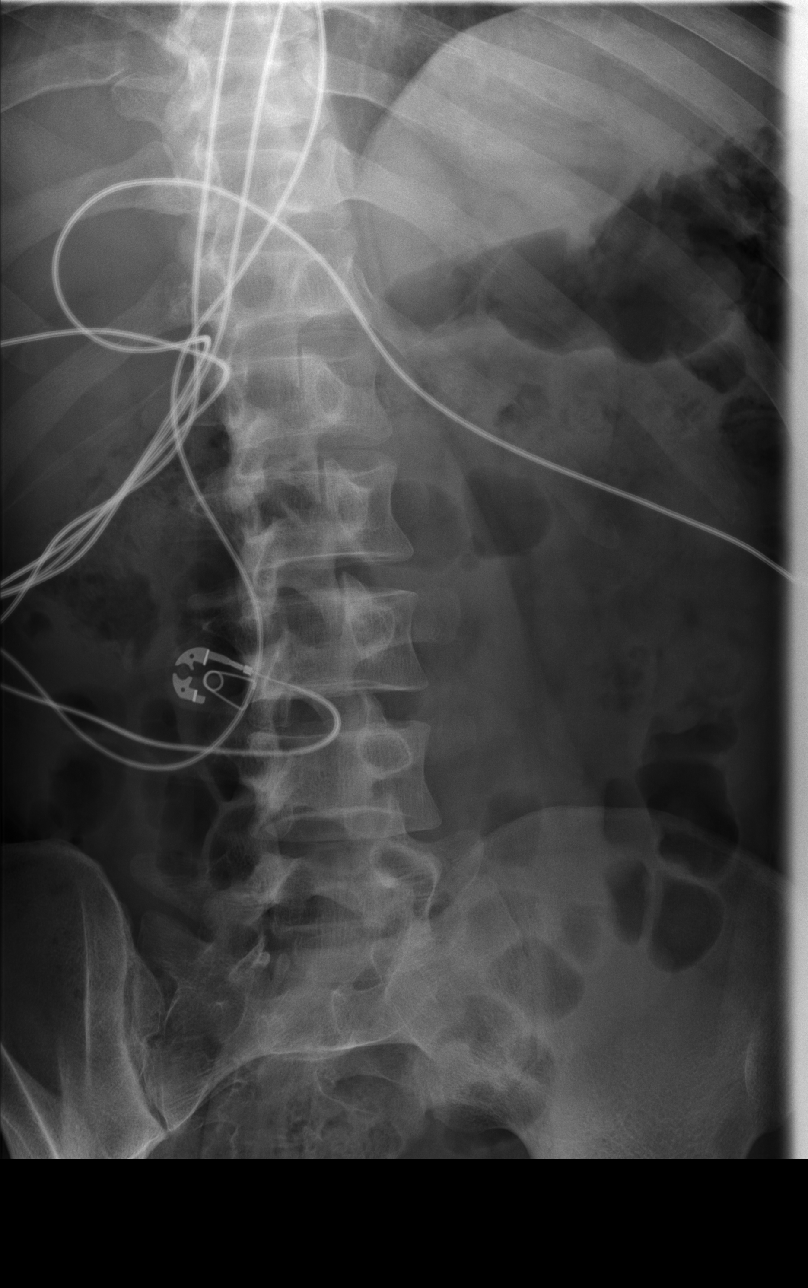

[t lumbar spine lat]
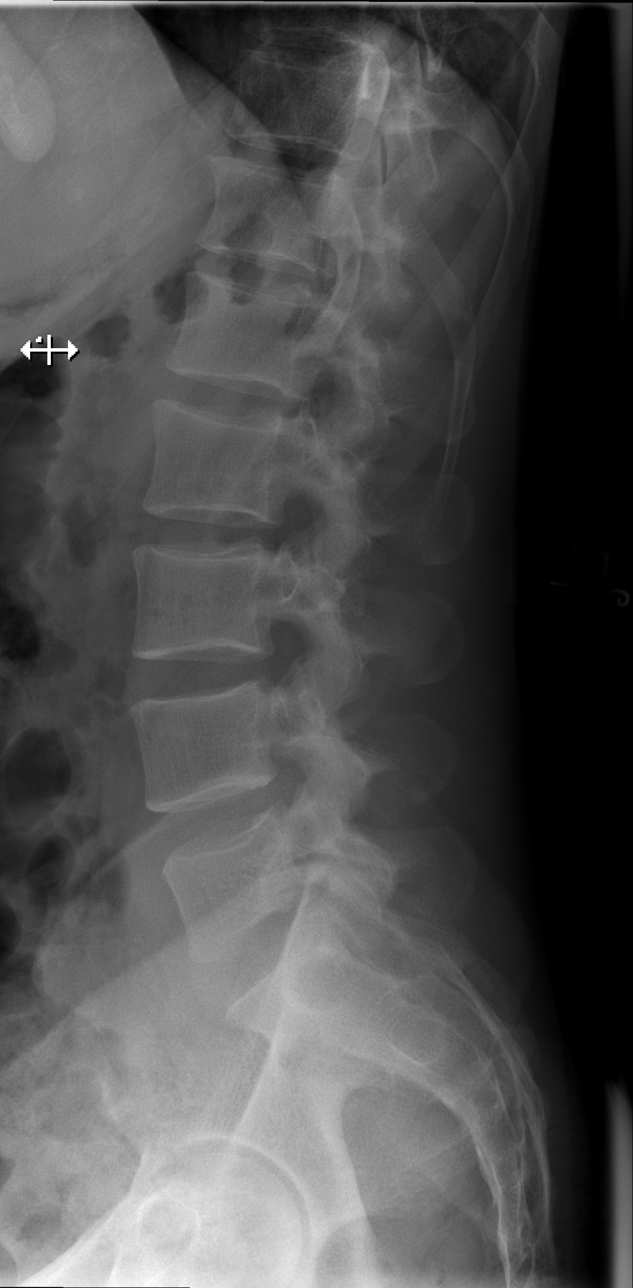

[t lumbar l-5 s-1 spot]
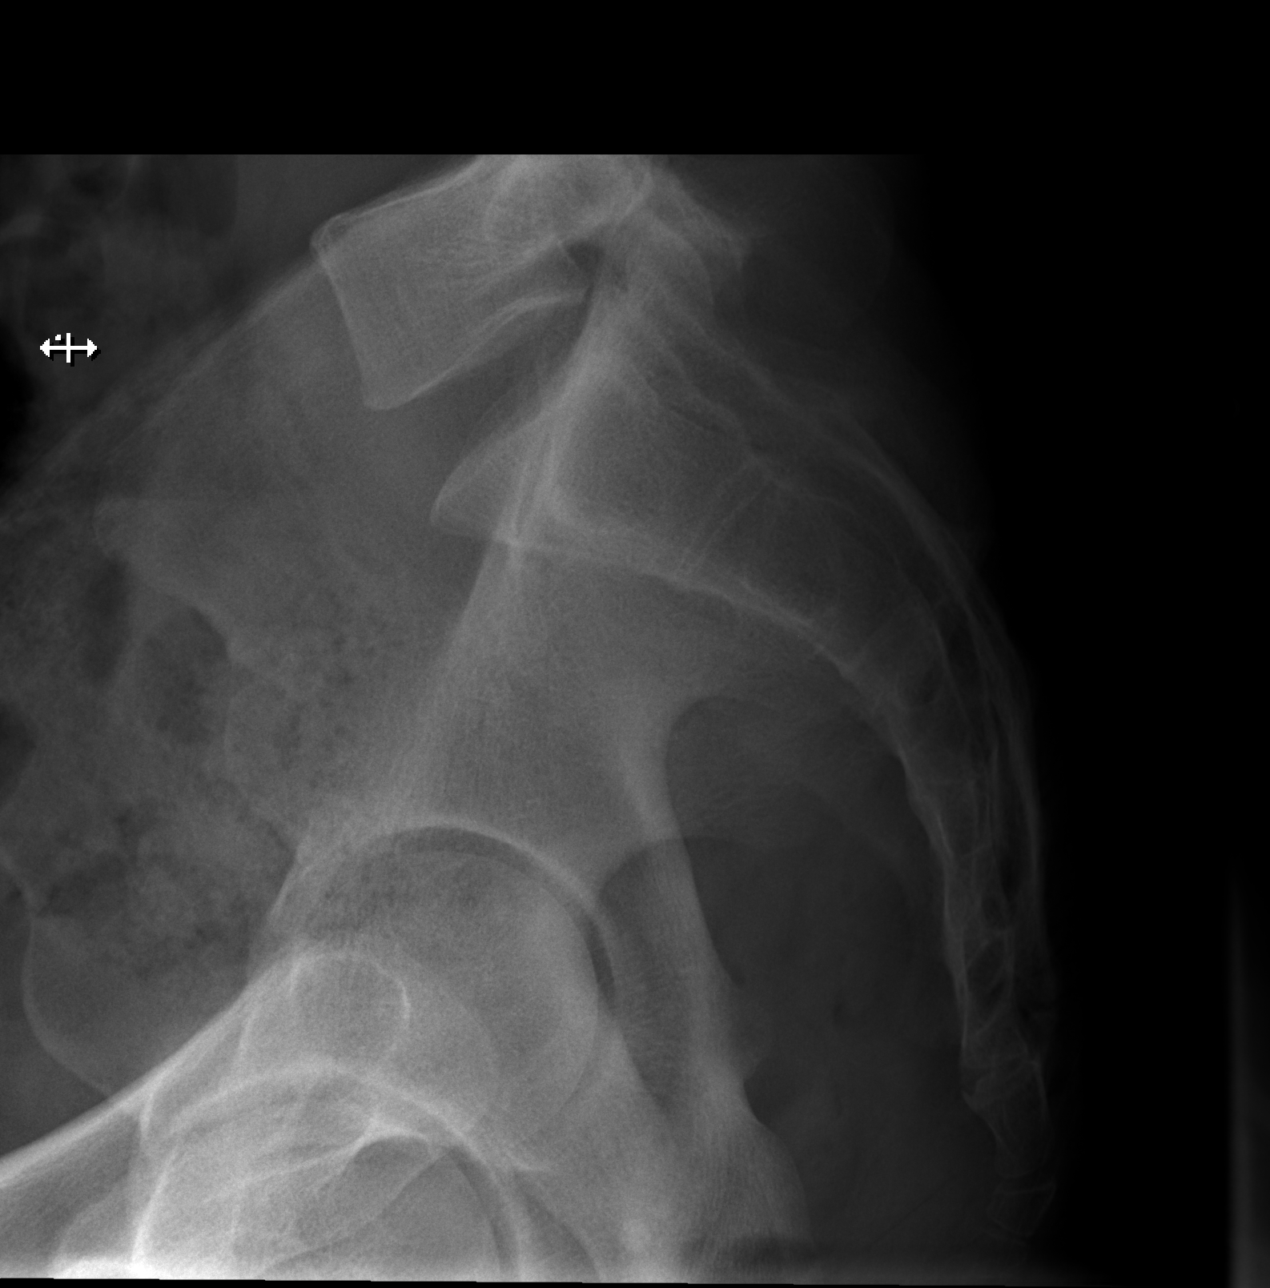

[5 of 5 positions shown; findings below may reference images not displayed]

FINDINGS: There is no evidence of thoracic spine fracture. Alignment is
normal. No other significant bone abnormalities are identified.

Five non rib-bearing lumbar-type vertebral bodies are intact and
aligned with maintenance of the lumbar lordosis. Mild sacralized L5.
No pars interarticularis defects. Intervertebral disc heights are
normal. No destructive bony lesions.

Sacroiliac joints are symmetric. Included prevertebral and
paraspinal soft tissue planes are non-suspicious.
IMPRESSION: Negative.

## 2024-06-22 NOTE — ED Provider Notes (Signed)
 Chief Complaint  Patient presents with  . Chest Pain  . Dizziness       HPI  History provided by:  Patient Chest Pain Pain location:  Substernal area Pain quality: aching   Pain radiates to:  Does not radiate Pain severity:  Moderate Onset quality:  Gradual Duration:  2 hours Timing:  Constant Progression:  Worsening Chronicity:  New Context: not lifting, not movement and not trauma   Relieved by:  Nothing Worsened by:  Nothing Ineffective treatments:  None tried Associated symptoms: fatigue, headache, nausea, near-syncope and shortness of breath   Associated symptoms: no abdominal pain, no anorexia, no back pain, no claudication, no diaphoresis, no dizziness, no fever, no heartburn, no lower extremity edema, no numbness, no palpitations, no PND and no vomiting   Risk factors: no prior DVT/PE       Patient History Medical History[1] Surgical History[2] Family History[3] Social History[4]    Review of Systems Review of Systems  Constitutional:  Positive for fatigue. Negative for diaphoresis and fever.  HENT:  Negative for sore throat.   Eyes:  Negative for redness.  Respiratory:  Positive for shortness of breath.   Cardiovascular:  Positive for chest pain and near-syncope. Negative for palpitations, claudication and PND.  Gastrointestinal:  Positive for nausea. Negative for abdominal pain, anorexia, heartburn and vomiting.  Genitourinary:  Negative for flank pain.  Musculoskeletal:  Negative for back pain.  Skin:  Negative for rash.  Neurological:  Positive for headaches. Negative for dizziness and numbness.  Psychiatric/Behavioral:  Negative for confusion.       Physical Exam ED Triage Vitals [06/22/24 1541]  Temp 98.1 F (36.7 C)  Heart Rate 71  Resp 16  BP (!) 136/101  MAP (mmHg) 114  SpO2 100 %  O2 Device None (Room air)  O2 Flow Rate (L/min)   Weight 94.8 kg (209 lb)   Physical Exam Constitutional:      General: He is in acute  distress.  HENT:     Head: Normocephalic and atraumatic.     Nose: Nose normal.     Mouth/Throat:     Mouth: Mucous membranes are moist.     Pharynx: No pharyngeal swelling or posterior oropharyngeal erythema.  Cardiovascular:     Rate and Rhythm: Normal rate and regular rhythm.  Pulmonary:     Effort: Pulmonary effort is normal. No tachypnea.     Breath sounds: Normal breath sounds.  Abdominal:     General: There is no distension.     Palpations: Abdomen is soft.     Tenderness: There is no abdominal tenderness.  Musculoskeletal:        General: Normal range of motion.     Cervical back: Normal range of motion. Normal range of motion.  Skin:    General: Skin is warm.     Findings: No rash.  Neurological:     General: No focal deficit present.     Mental Status: He is alert. Mental status is at baseline.  Psychiatric:        Behavior: Behavior normal.        CHA2DS2-VASc Score: N/A  Glasgow Coma Scale Score: 15        History Score: 1, ECG Score: 0, Age Score: 0, Risk Score: 2, HEAR SCORE: 3, Troponin Score: 0, HEART SCORE: 3         Procedures  ED Course & MDM   Medical Decision Making EKG: EKG reveals normal sinus rhythm and no signs of acute ischemia, ST segment elevation or depression.  Normal PR, QRS, QT.  Normal axis noted.  No ventricular hypertrophy noted.  No dysrhythmia noted.  Rate of 69.  I personally reviewed interpret the EKG.  38 year old male with past medical and surgical history as noted above who presents in the setting of chest discomfort, lightheadedness, near syncope, headache and fatigue.  History given by patient as well as EMS.  Patient reports he has been feeling unwell today.  Reports he was walking to a friend's house when he became lightheaded.  Does not believe he had syncopal event but called EMS for feeling unwell.  Said no vomiting.  No abdominal pain.  Has had some slight chest pressure.  No suspicious food intake.  No  recent starting or stopping medications.  Due to this issue came in today further evaluation.  On arrival patient was hemodynamically stable and afebrile.  On examination lungs clear to auscultation.  No murmurs rubs or gallops.  EKG obtained as noted above with no acute ischemia or dysrhythmia.  Otherwise reassuring evaluation.  Differential diagnosis includes ACS, musculoskeletal pain, GERD, dehydration.  Patient is PERC negative low suspicion for PE.  He reports headache is different from baseline and began approximately 3 to 4 hours ago.  No numbness or weakness with this.  Due to all these issues came in for further evaluation.  Will obtain CT of head to rule out subarachnoid hemorrhage as he is within 6-hour window.  Will ensure no mass or lesion as well.  Will obtain basic labs to include troponin x 2.  Will give IV fluids as well as medication for discomfort.  Will continue to monitor.  Disposition per results.  On most reassessments resting comfortably.  Sleeping at most times over awake and has no acute complaints.  Patient have slight elevation in lipase but he denies any vomiting or abdominal pain and has no tenderness on exam.  Troponin x 2 without elevation.  Imaging of head, which I personally reviewed interpret, reveals no acute intracranial abnormality.  No significant abnormality liver enzymes.  No signs of heart failure.  COVID-19 and influenza negative.  Otherwise reassuring labs and imaging.  On reassessment he is resting comfortably.  Tolerating p.o. intake.  After shared decision making patient was comfortable plan to discharge home.  Will discharge home with close outpatient follow-up.  Strict return precaution given.  Stable at time of discharge.  I personally reviewed external records.  Note: Chief executive officer was used in the creation of this note.  Throughout the entirety of patient encounter I wore personal protective equipment in accordance with guidelines at that  time. Additionally, if controlled substance prescribed during this visit then appropriate database was reviewed prior to prescription initiation.  Problems Addressed: Chest pain, unspecified type: complicated acute illness or injury Lightheadedness: complicated acute illness or injury  Amount and/or Complexity of Data Reviewed Independent Historian: EMS External Data Reviewed: notes.    Details: Previous psychiatry evaluation Labs: ordered. Decision-making details documented in ED Course. Radiology: ordered and independent interpretation performed. Decision-making details documented in ED Course. ECG/medicine tests: ordered and independent interpretation performed. Decision-making details documented in ED Course.  Risk OTC drugs. Prescription drug management. Decision regarding hospitalization.     CT Head WO Contrast W Quant CT Tiss Character When Performed  Final Result by Franky Alm Conn, MD (11/12 1630)  CT  HEAD WITHOUT CONTRAST, 06/22/2024 4:25 PM    INDICATION: headache and may have had syncope   COMPARISON: Head and neck CTA 09/13/2022    TECHNIQUE: Axial CT images of the brain from skull base to vertex,   including portions of the face and sinuses, were obtained without   contrast. Supplemental 2D reformatted images were generated and reviewed   as needed.    All CT scans at [facility] and [facility]   Imaging are performed using radiation dose optimization techniques as   appropriate to a performed exam, including but not limited to one or more   of the following: automatic exposure control, adjustment of the mA and/or   kV according to patient size, use of iterative reconstruction technique.   In addition, our institution participates in a radiation dose monitoring   program to optimize patient radiation exposure.    FINDINGS:  Calvarium/skull base: No evidence of acute fracture or destructive lesion.   Mastoids and middle ears  demonstrate no substantial mucosal disease.    Paranasal sinuses: No air fluid levels.    Brain: No acute large vascular territory infarct. No mass effect.   Unchanged mild prominence of the lateral ventricles. No acute hemorrhage.    IMPRESSION:  No evidence of acute intracranial abnormality. Consider MRI for further   evaluation if there is ongoing clinical concern.     PDMP reviewed, no concerning pattern identified  ED Disposition:  Discharge Final diagnoses:  Chest pain, unspecified type  Lightheadedness    ED Prescriptions   None           [1] History reviewed. No pertinent past medical history. [2] History reviewed. No pertinent surgical history. [3] No family history on file. [4] Social History Tobacco Use  . Smoking status: Every Day    Current packs/day: 0.50    Types: Cigarettes  Substance Use Topics  . Alcohol use: No  . Drug use: Yes    Types: Fentanyl , Marijuana

## 2024-07-16 ENCOUNTER — Ambulatory Visit (HOSPITAL_COMMUNITY): Admission: EM | Admit: 2024-07-16 | Discharge: 2024-07-16 | Disposition: A | Discharge status: Home / Self Care
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
